# Patient Record
Sex: Female | Born: 2000 | Race: White | Hispanic: No | Smoking: Never smoker
Health system: Southern US, Community
[De-identification: ages and names within clinical notes are randomized; demographics above are authoritative.]

## PROBLEM LIST (undated history)

## (undated) DIAGNOSIS — F419 Anxiety disorder, unspecified: Secondary | ICD-10-CM

## (undated) DIAGNOSIS — F909 Attention-deficit hyperactivity disorder, unspecified type: Secondary | ICD-10-CM

## (undated) DIAGNOSIS — N946 Dysmenorrhea, unspecified: Secondary | ICD-10-CM

## (undated) DIAGNOSIS — I499 Cardiac arrhythmia, unspecified: Secondary | ICD-10-CM

## (undated) DIAGNOSIS — E739 Lactose intolerance, unspecified: Secondary | ICD-10-CM

## (undated) HISTORY — DX: Attention-deficit hyperactivity disorder, unspecified type: F90.9

## (undated) HISTORY — DX: Cardiac arrhythmia, unspecified: I49.9

## (undated) HISTORY — PX: NO PAST SURGERIES: SHX2092

## (undated) HISTORY — DX: Anxiety disorder, unspecified: F41.9

## (undated) HISTORY — DX: Lactose intolerance, unspecified: E73.9

## (undated) HISTORY — DX: Dysmenorrhea, unspecified: N94.6

---
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
## 2020-08-26 ENCOUNTER — Ambulatory Visit: Payer: 59 | Admitting: Internal Medicine

## 2021-03-12 DIAGNOSIS — O1490 Unspecified pre-eclampsia, unspecified trimester: Secondary | ICD-10-CM

## 2021-03-12 DIAGNOSIS — Z98891 History of uterine scar from previous surgery: Secondary | ICD-10-CM | POA: Insufficient documentation

## 2021-03-12 HISTORY — DX: History of uterine scar from previous surgery: Z98.891

## 2021-03-12 HISTORY — DX: Unspecified pre-eclampsia, unspecified trimester: O14.90

## 2021-10-03 IMAGING — US US BREAST*R* LIMITED INC AXILLA
1 series · 3 of 3 positions shown · non-contrast
Comparison: None.

CLINICAL DATA: 19-year-old female with a right breast palpable lump
with associated tenderness when palpated.

EXAM:
ULTRASOUND OF THE RIGHT BREAST

[Series 1: us breast*right* limited inc axilla · 0.07mm/px · 3 of 3 slices shown]
[im 1/3]
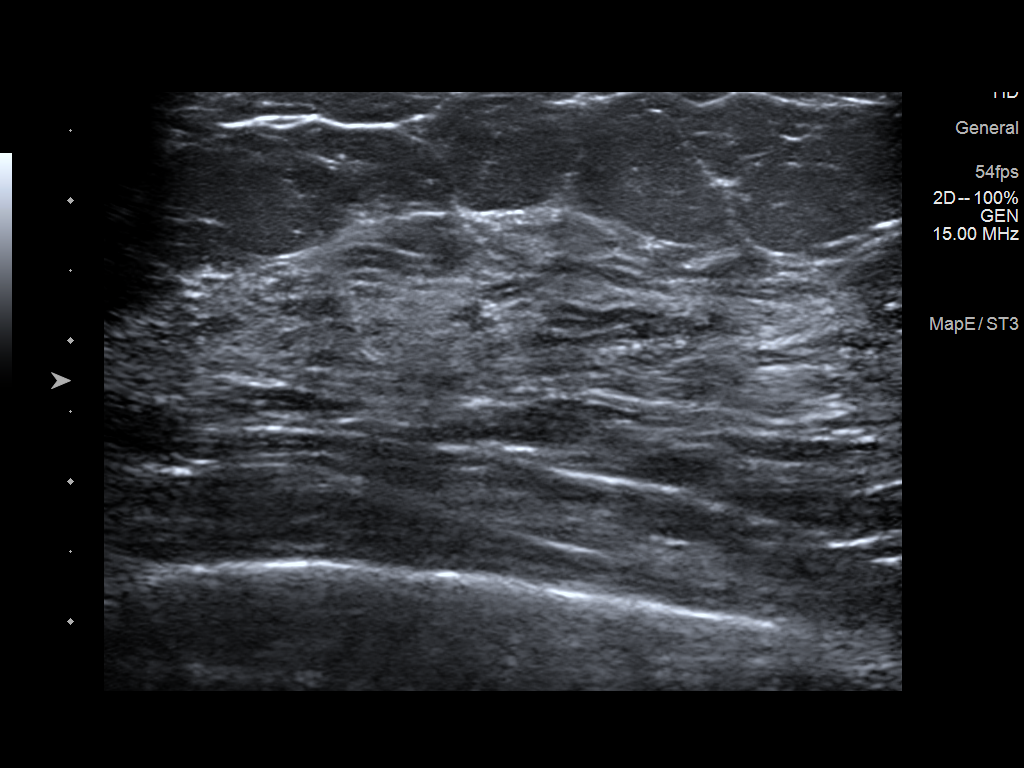
[im 2/3]
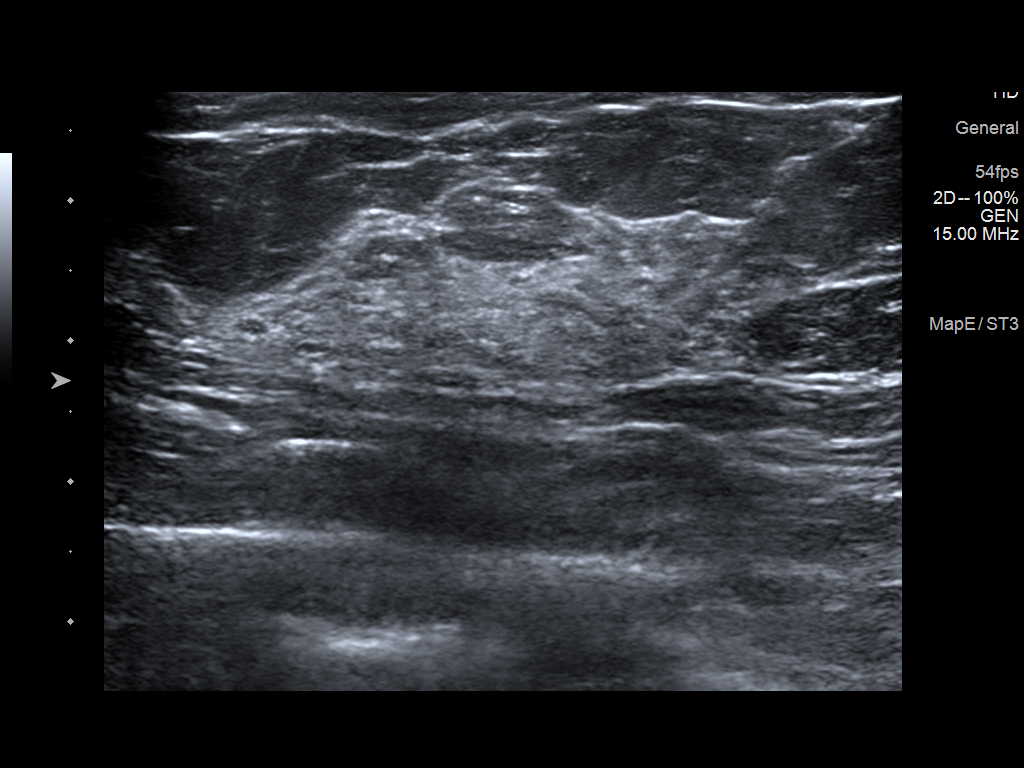
[im 3/3]
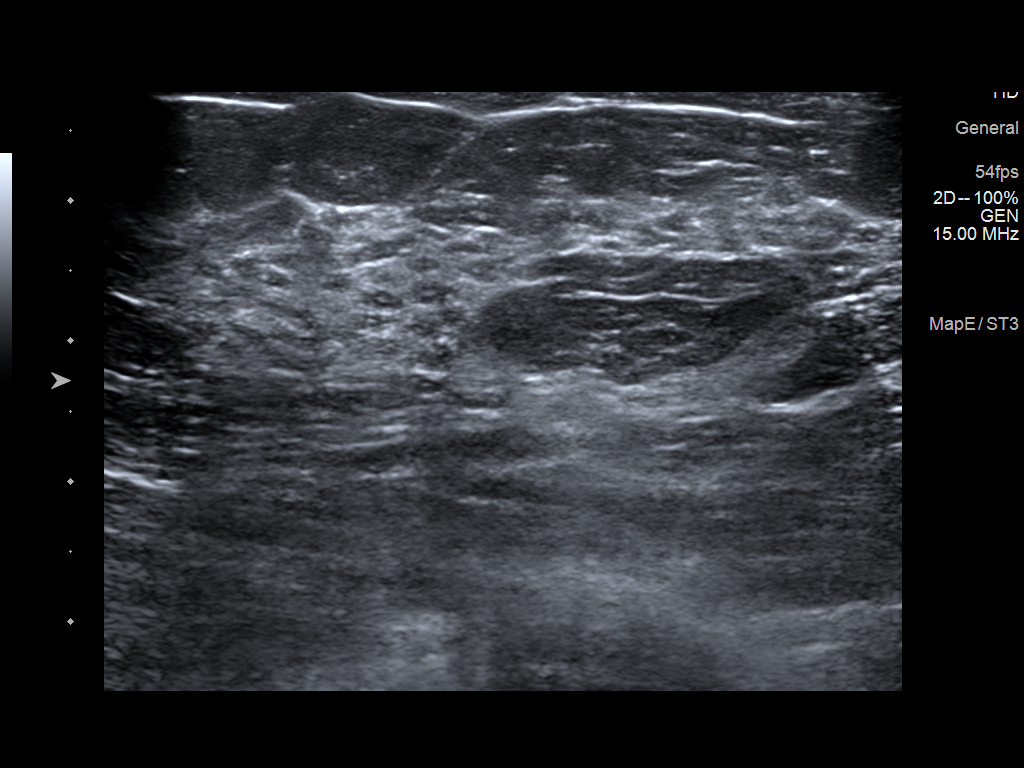

[3 of 3 positions shown; findings below may reference images not displayed]

FINDINGS: Targeted ultrasound is performed, showing focal fibroglandular
tissue without suspicious sonographic abnormality at the site of the
patient's right breast palpable lump.
IMPRESSION: No suspicious sonographic findings.

RECOMMENDATION:
1. Clinical follow-up recommended for the palpable area of concern
in the right breast. Any further workup should be based on clinical
grounds.
2. Screening mammogram at age 40 unless there are persistent or
intervening clinical concerns. (Code:[id number])

I have discussed the findings and recommendations with the patient.
If applicable, a reminder letter will be sent to the patient
regarding the next appointment.

BI-RADS CATEGORY  1: Negative.

## 2021-10-06 DIAGNOSIS — I10 Essential (primary) hypertension: Secondary | ICD-10-CM

## 2021-10-06 HISTORY — DX: Essential (primary) hypertension: I10

## 2021-10-10 DIAGNOSIS — R87619 Unspecified abnormal cytological findings in specimens from cervix uteri: Secondary | ICD-10-CM | POA: Insufficient documentation

## 2021-10-13 ENCOUNTER — Ambulatory Visit: Payer: Medicaid Other | Admitting: Nurse Practitioner

## 2021-10-13 ENCOUNTER — Encounter: Payer: Self-pay | Admitting: Nurse Practitioner

## 2021-10-13 VITALS — BP 136/84 | HR 112 | Temp 98.0°F | Ht 62.21 in | Wt 180.0 lb

## 2021-10-13 DIAGNOSIS — R030 Elevated blood-pressure reading, without diagnosis of hypertension: Secondary | ICD-10-CM | POA: Diagnosis not present

## 2021-10-13 DIAGNOSIS — E66811 Obesity, class 1: Secondary | ICD-10-CM | POA: Insufficient documentation

## 2021-10-13 DIAGNOSIS — Z8614 Personal history of Methicillin resistant Staphylococcus aureus infection: Secondary | ICD-10-CM | POA: Insufficient documentation

## 2021-10-13 DIAGNOSIS — R079 Chest pain, unspecified: Secondary | ICD-10-CM | POA: Insufficient documentation

## 2021-10-13 DIAGNOSIS — E669 Obesity, unspecified: Secondary | ICD-10-CM | POA: Diagnosis not present

## 2021-10-13 DIAGNOSIS — Z6832 Body mass index (BMI) 32.0-32.9, adult: Secondary | ICD-10-CM

## 2021-10-13 DIAGNOSIS — R5383 Other fatigue: Secondary | ICD-10-CM

## 2021-10-13 HISTORY — DX: Personal history of Methicillin resistant Staphylococcus aureus infection: Z86.14

## 2021-10-13 HISTORY — DX: Other fatigue: R53.83

## 2021-10-13 NOTE — Assessment & Plan Note (Signed)
Will refer to infectious disease for assistance with eradicating any MRSA that may be living on her skin.

## 2021-10-13 NOTE — Assessment & Plan Note (Signed)
Most likely multifactorial and probably related to the fact that she is getting interrupted sleep at night.  Will also order blood work for further evaluation, further recommendations may be made based upon these results.

## 2021-10-13 NOTE — Patient Instructions (Signed)
Have patient sign ROI for all previous medical records for all dates, then please request them.

## 2021-10-13 NOTE — Progress Notes (Signed)
Established Patient Office Visit  Subjective   Patient ID: Jenny Welch, female    DOB: 04-May-2000  Age: 21 y.o. MRN: [id number]  Chief Complaint  Patient presents with   new patient    Weight concerns and lack of energy    Patient arrives today to establish care as a new patient.  She reports that she was being treated by pediatrics, but was recommended to establish with adult health care provider.  Her concerns today are as listed below.  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  She would like to discuss how to eradicate this if possible.  Fatigue/obesity: She has a 74-month-old infant and reports that she does sleep 8 hours a night, however it is still interrupted by her daughter waking up.  She reports she has no energy or drive to work out and would like to be evaluated for this.  She does have family history of hypothyroidism.  She would also like to discuss weight loss options.  History of preeclampsia/elevated blood pressure/chest pain: She reports gestational hypertension as well as preeclampsia which is now resolved.  She was treated with labetalol during pregnancy and aspirin.  She also reports that she has weekly chest pain that seems to be triggered by carrying heavy objects but she also experienced shortness of breath with exertion.  She also reports history of irregular heart rhythm but was told by her pediatrician that although rhythm is regular its benign.  Pain will occur in the left side of her chest and will radiate down her left arm.  She does feel anxious when the pain occurs, but does not report anxiety as a trigger.  Has only noticed the pain started occurring ever since her daughter was born having to carry the infant car seat.  Pain will spontaneously resolve within a few hours.  She has not tried any over-the-counter treatment for pain.    Review of Systems  Constitutional:  Negative for fever, malaise/fatigue and  weight loss (weight gain).  Respiratory:  Negative for shortness of breath.   Cardiovascular:  Negative for chest pain.  Gastrointestinal:  Negative for abdominal pain and blood in stool.  Psychiatric/Behavioral:  The patient is nervous/anxious (at times with chest pain).       Objective:     BP 136/84 (BP Location: Right Arm, Patient Position: Sitting, Cuff Size: Large)   Pulse (!) 112   Temp 98 F (36.7 C) (Oral)   Ht 5' 2.21" (1.58 m)   Wt 180 lb (81.6 kg)   SpO2 99%   BMI 32.71 kg/m    Physical Exam Vitals reviewed.  Constitutional:      General: She is not in acute distress.    Appearance: Normal appearance.  HENT:     Head: Normocephalic and atraumatic.  Neck:     Vascular: No carotid bruit.  Cardiovascular:     Rate and Rhythm: Normal rate and regular rhythm.     Pulses: Normal pulses.     Heart sounds: Normal heart sounds.  Pulmonary:     Effort: Pulmonary effort is normal.     Breath sounds: Normal breath sounds.  Skin:    General: Skin is warm and dry.  Neurological:     General: No focal deficit present.     Mental Status: She is alert and oriented to person, place, and time.  Psychiatric:        Mood and Affect: Mood normal.  Behavior: Behavior normal.        Judgment: Judgment normal.     EKG: NSR, rate 93 No results found for any visits on 10/13/21.    The ASCVD Risk score (Arnett DK, et al., 2019) failed to calculate for the following reasons:   The 2019 ASCVD risk score is only valid for ages 49 to 76    Assessment & Plan:   Problem List Items Addressed This Visit       Other   History of MRSA infection - Primary    Will refer to infectious disease for assistance with eradicating any MRSA that may be living on her skin.      Relevant Orders   Ambulatory referral to Infectious Disease   Class 1 obesity without serious comorbidity with body mass index (BMI) of 32.0 to 32.9 in adult    Chronic, recommend we first focus on  lifestyle modification.  Recommend considering trying intermittent fasting with 16-hour fast up to 7 days a week as tolerated.  We discussed the importance of hydration during fasting and to break fast if she experiences any headache, tremor, lightheadedness, overall not feeling well while fasting.  We also discussed possibility of pharmacological treatment and that I would recommend we get her blood pressure a bit more controlled before initiating medication such as phentermine.  [name] would be a good option however this is out of stock and is not covered by her current insurance.  Patient reports her understanding.      Relevant Orders   TSH   Hemoglobin A1c   Lipid panel   Comprehensive metabolic panel   CBC   Ambulatory referral to Infectious Disease   T3, free   T4, free   Vitamin B12   VITAMIN D 25 Hydroxy (Vit-D Deficiency, Fractures)   Elevated blood pressure reading    Blood pressure borderline elevated today, will not initiate antihypertensive therapy at this visit as this is her initial visit.  Will have to monitor closely, if blood pressure remains elevated will consider antihypertensive options.      Relevant Orders   TSH   Hemoglobin A1c   Lipid panel   Comprehensive metabolic panel   CBC   Ambulatory referral to Infectious Disease   T3, free   T4, free   Vitamin B12   VITAMIN D 25 Hydroxy (Vit-D Deficiency, Fractures)   Fatigue    Most likely multifactorial and probably related to the fact that she is getting interrupted sleep at night.  Will also order blood work for further evaluation, further recommendations may be made based upon these results.      Relevant Orders   T3, free   T4, free   Vitamin B12   VITAMIN D 25 Hydroxy (Vit-D Deficiency, Fractures)   Chest pain    Sounds muscular in origin.  Recommend she use ice, heat, Tylenol, and ibuprofen as needed.  May consider muscle relaxer if these medications do not result in improvement in her pain.  May also  consider referral to cardiology if pain persist despite these treatment options/or if characteristics of chest pain change such as if it becomes exertional.  Patient reports understanding.      Relevant Orders   EKG 12-Lead    Return in about 1 month (around 11/13/2021) for CPE with Mikhayla Phillis.    Elenore Paddy, NP

## 2021-10-13 NOTE — Assessment & Plan Note (Signed)
Chronic, recommend we first focus on lifestyle modification.  Recommend considering trying intermittent fasting with 16-hour fast up to 7 days a week as tolerated.  We discussed the importance of hydration during fasting and to break fast if she experiences any headache, tremor, lightheadedness, overall not feeling well while fasting.  We also discussed possibility of pharmacological treatment and that I would recommend we get her blood pressure a bit more controlled before initiating medication such as phentermine.  [name] would be a good option however this is out of stock and is not covered by her current insurance.  Patient reports her understanding.

## 2021-10-13 NOTE — Assessment & Plan Note (Signed)
Sounds muscular in origin.  Recommend she use ice, heat, Tylenol, and ibuprofen as needed.  May consider muscle relaxer if these medications do not result in improvement in her pain.  May also consider referral to cardiology if pain persist despite these treatment options/or if characteristics of chest pain change such as if it becomes exertional.  Patient reports understanding.

## 2021-10-13 NOTE — Assessment & Plan Note (Signed)
Blood pressure borderline elevated today, will not initiate antihypertensive therapy at this visit as this is her initial visit.  Will have to monitor closely, if blood pressure remains elevated will consider antihypertensive options.

## 2021-10-14 ENCOUNTER — Other Ambulatory Visit (INDEPENDENT_AMBULATORY_CARE_PROVIDER_SITE_OTHER): Payer: Medicaid Other

## 2021-10-14 DIAGNOSIS — E669 Obesity, unspecified: Secondary | ICD-10-CM | POA: Diagnosis not present

## 2021-10-14 DIAGNOSIS — Z6832 Body mass index (BMI) 32.0-32.9, adult: Secondary | ICD-10-CM | POA: Diagnosis not present

## 2021-10-14 DIAGNOSIS — R030 Elevated blood-pressure reading, without diagnosis of hypertension: Secondary | ICD-10-CM

## 2021-10-14 DIAGNOSIS — R5383 Other fatigue: Secondary | ICD-10-CM

## 2021-10-14 LAB — CBC
HCT: 43 % (ref 36.0–46.0)
Hemoglobin: 14.3 g/dL (ref 12.0–15.0)
MCHC: 33.3 g/dL (ref 30.0–36.0)
MCV: 87.8 fl (ref 78.0–100.0)
Platelets: 384 10*3/uL (ref 150.0–400.0)
RBC: 4.9 Mil/uL (ref 3.87–5.11)
RDW: 14.5 % (ref 11.5–15.5)
WBC: 9.7 10*3/uL (ref 4.0–10.5)

## 2021-10-14 LAB — COMPREHENSIVE METABOLIC PANEL
ALT: 26 U/L (ref 0–35)
AST: 15 U/L (ref 0–37)
Albumin: 4.4 g/dL (ref 3.5–5.2)
Alkaline Phosphatase: 46 U/L (ref 39–117)
BUN: 9 mg/dL (ref 6–23)
CO2: 26 mEq/L (ref 19–32)
Calcium: 9.2 mg/dL (ref 8.4–10.5)
Chloride: 102 mEq/L (ref 96–112)
Creatinine, Ser: 0.76 mg/dL (ref 0.40–1.20)
GFR: 112.15 mL/min (ref >60.00)
Glucose, Bld: 88 mg/dL (ref 70–99)
Potassium: 4 mEq/L (ref 3.5–5.1)
Sodium: 136 mEq/L (ref 135–145)
Total Bilirubin: 0.4 mg/dL (ref 0.2–1.2)
Total Protein: 7.4 g/dL (ref 6.0–8.3)

## 2021-10-14 LAB — HEMOGLOBIN A1C: Hgb A1c MFr Bld: 5.3 % (ref 4.6–6.5)

## 2021-10-14 LAB — LIPID PANEL
Cholesterol: 159 mg/dL (ref 0–200)
HDL: 37 mg/dL — ABNORMAL LOW (ref >39.00)
LDL Cholesterol: 102 mg/dL — ABNORMAL HIGH (ref 0–99)
NonHDL: 121.95
Total CHOL/HDL Ratio: 4
Triglycerides: 99 mg/dL (ref 0.0–149.0)
VLDL: 19.8 mg/dL (ref 0.0–40.0)

## 2021-10-14 LAB — VITAMIN D 25 HYDROXY (VIT D DEFICIENCY, FRACTURES): VITD: 19.6 ng/mL — ABNORMAL LOW (ref 30.00–100.00)

## 2021-10-14 LAB — T4, FREE: Free T4: 0.9 ng/dL (ref 0.60–1.60)

## 2021-10-14 LAB — T3, FREE: T3, Free: 3.5 pg/mL (ref 2.3–4.2)

## 2021-10-14 LAB — TSH: TSH: 2.02 u[IU]/mL (ref 0.35–5.50)

## 2021-10-14 LAB — VITAMIN B12: Vitamin B-12: 497 pg/mL (ref 211–911)

## 2021-10-17 ENCOUNTER — Telehealth: Payer: Self-pay | Admitting: Nurse Practitioner

## 2021-10-21 ENCOUNTER — Other Ambulatory Visit: Payer: Self-pay

## 2021-10-21 ENCOUNTER — Encounter: Payer: Self-pay | Admitting: Internal Medicine

## 2021-10-21 ENCOUNTER — Ambulatory Visit: Payer: Medicaid Other | Admitting: Internal Medicine

## 2021-10-21 VITALS — BP 138/95 | HR 105 | Temp 97.4°F | Ht 62.0 in | Wt 182.0 lb

## 2021-10-21 DIAGNOSIS — Z872 Personal history of diseases of the skin and subcutaneous tissue: Secondary | ICD-10-CM | POA: Diagnosis not present

## 2021-10-21 DIAGNOSIS — B958 Unspecified staphylococcus as the cause of diseases classified elsewhere: Secondary | ICD-10-CM | POA: Insufficient documentation

## 2021-10-21 MED ORDER — CHLORHEXIDINE GLUCONATE 4 % EX LIQD: CUTANEOUS | 0 refills | Status: DC

## 2021-10-21 MED ORDER — MUPIROCIN 2 % EX OINT: TOPICAL_OINTMENT | CUTANEOUS | 0 refills | Status: DC

## 2021-10-21 NOTE — Assessment & Plan Note (Signed)
  Discussed with patient that Staph aureus is a common skin bacteria amongst many bacteria that can colonize our skin.  She has no history of MRSA documented but did have an infected incision in February 2023 due to MSSA that resolved with Bactrim.  She otherwise self reports prior MRSA infections.  Discussed Staph aureus decolonization protocol and she would like to attempt.  Will do mupirocin 2% applied to nares x 5 days and chlorhexidine 2% daily washes x 5 days.  Follow up as needed.

## 2021-10-21 NOTE — Progress Notes (Signed)
[facility]  Reason for Consult: MRSA colonization  Referring Provider: Jiles Prows, NP   HPI:    Jenny Welch is a 21 y.o. female with PMHx as below who presents to the clinic for MRSA colonization.   Patient recently established care with her new PCP on 10/13/21.  She reports history of multiple skin abscesses secondary to MRSA.  She would like to discuss how to eradicate if possible.  She was referred to our clinic.  Her last infection was several weeks ago and self resolved. She has no history of diabetes.  No other major medical problems.   In February 2023 she had some pain and drainage from an incision.  This was cultured and grew MSSA.  Treated with Bactrim and improved. There are no other culture reports available. She reports otherwise being told she had MRSA from urgent care visits.  Patient's Medications  New Prescriptions   CHLORHEXIDINE (HIBICLENS) 4 % EXTERNAL LIQUID    Apply topically daily for 5 days.   MUPIROCIN OINTMENT (BACTROBAN) 2 %    Apply to each nares daily for 5 days.  Previous Medications   No medications on file  Modified Medications   No medications on file  Discontinued Medications   No medications on file      Past Medical History:  Diagnosis Date   ADHD (attention deficit hyperactivity disorder)    Anxiety    Arrhythmia    Dysmenorrhea    Fatigue 10/13/2021   Lactose intolerance    Migraine without aura and with status migrainosus, not intractable 07/06/2019    Social History   Tobacco Use   Smoking status: Never   Smokeless tobacco: Never  Vaping Use   Vaping Use: Former  Substance Use Topics   Alcohol use: Not Currently   Drug use: Never    Family History  Problem Relation Age of Onset   Allergies Mother    Thyroid disease Mother    Migraines Mother    Allergies Father    Other Father        blood clots   Migraines Brother     Allergies  Allergen Reactions   Milk-Related Compounds    Other      Fruits: oranges    Review of Systems  Constitutional: Negative.   Respiratory: Negative.    Cardiovascular: Negative.   Skin: Negative.       OBJECTIVE:    Vitals:   10/21/21 1352  BP: (!) 138/95  Pulse: (!) 105  Temp: (!) 97.4 F (36.3 C)  TempSrc: Oral  SpO2: 97%  Weight: 182 lb (82.6 kg)  Height: 5\' 2"  (1.575 m)     Body mass index is 33.29 kg/m.  Physical Exam Constitutional:      General: She is not in acute distress.    Appearance: Normal appearance.  Skin:    General: Skin is warm and dry.     Findings: No rash.     Comments: Right hip with old scar from prior boil.   Neurological:     General: No focal deficit present.     Mental Status: She is alert and oriented to person, place, and time.  Psychiatric:        Mood and Affect: Mood normal.        Behavior: Behavior normal.      Labs and Microbiology:     Latest Ref Rng & Units 10/14/2021    8:14 AM 07/03/2019  4:32 PM 11/16/2017   11:00 PM  CBC  WBC 4.0 - 10.5 K/uL 9.7  8.9  11.0   Hemoglobin 12.0 - 15.0 g/dL 26.8  34.1  96.2   Hematocrit 36.0 - 46.0 % 43.0  43.0  44.7   Platelets 150.0 - 400.0 K/uL 384.0  342  343       Latest Ref Rng & Units 10/14/2021    8:14 AM 07/03/2019    4:32 PM 11/16/2017   11:00 PM  CMP  Glucose 70 - 99 mg/dL 88  87  229   BUN 6 - 23 mg/dL 9  7  6    Creatinine 0.40 - 1.20 mg/dL  7.98  9.21   Sodium 135 - 145 mEq/L 136  139  139   Potassium 3.5 - 5.1 mEq/L 4.0  4.1  3.7   Chloride 96 - 112 mEq/L 102  102  106   CO2 19 - 32 mEq/L 26  25  24    Calcium 8.4 - 10.5 mg/dL 9.2  9.6  9.9   Total Protein 6.0 - 8.3 g/dL 7.4  7.3  8.1   Total Bilirubin 0.2 - 1.2 mg/dL 0.4  0.4  0.7   Alkaline Phos 39 - 117 U/L 46  48  38   AST 0 - 37 U/L 15  17  17    ALT 0 - 35 U/L 26  12  14         ASSESSMENT & PLAN:    Staph infection  Discussed with patient that Staph aureus is a common skin bacteria amongst many bacteria that can colonize our skin.  She has no  history of MRSA documented but did have an infected incision in February 2023 due to MSSA that resolved with Bactrim.  She otherwise self reports prior MRSA infections.  Discussed Staph aureus decolonization protocol and she would like to attempt.  Will do mupirocin 2% applied to nares x 5 days and chlorhexidine 2% daily washes x 5 days.  Follow up as needed.        for Infectious Disease [facility] 10/21/2021, 2:19 PM

## 2021-10-21 NOTE — Patient Instructions (Signed)
Thank you for coming to see me today. It was a pleasure seeing you.  To Do: Apply mupirocin to nostrils daily x 5 days Do chlorhexidine daily washes x 5-14 days Follow up as needed  If you have any questions or concerns, please do not hesitate to call the office at [phone].  Take Care,   Gwynn Burly

## 2021-11-17 ENCOUNTER — Ambulatory Visit (INDEPENDENT_AMBULATORY_CARE_PROVIDER_SITE_OTHER): Payer: Medicaid Other | Admitting: Nurse Practitioner

## 2021-11-17 VITALS — BP 134/88 | HR 115 | Temp 98.3°F | Ht 62.0 in | Wt 182.4 lb

## 2021-11-17 DIAGNOSIS — Z0001 Encounter for general adult medical examination with abnormal findings: Secondary | ICD-10-CM | POA: Diagnosis not present

## 2021-11-17 DIAGNOSIS — E669 Obesity, unspecified: Secondary | ICD-10-CM | POA: Diagnosis not present

## 2021-11-17 DIAGNOSIS — E559 Vitamin D deficiency, unspecified: Secondary | ICD-10-CM

## 2021-11-17 DIAGNOSIS — Z6832 Body mass index (BMI) 32.0-32.9, adult: Secondary | ICD-10-CM

## 2021-11-17 DIAGNOSIS — E785 Hyperlipidemia, unspecified: Secondary | ICD-10-CM | POA: Diagnosis not present

## 2021-11-17 DIAGNOSIS — B958 Unspecified staphylococcus as the cause of diseases classified elsewhere: Secondary | ICD-10-CM | POA: Diagnosis not present

## 2021-11-17 NOTE — Progress Notes (Signed)
Established Patient Office Visit  Subjective   Patient ID: Jenny Welch, female    DOB: April 01, 2000  Age: 21 y.o. MRN: [id number]  Chief Complaint  Patient presents with   Annual Exam   vitamin d deficiency    History of skin abscesses: Patient saw infectious disease and they provided her with mupirocin ointment as well as chlorhexidine skin cleanser to hopefully eradicate any MRSA that may be on her skin.  Vitamin D deficiency: Last office visit vitamin D level was collected and it was 15.  She is not currently on any vitamin D supplementation.  Hyperlipidemia: Last office visit LDL was 102, HDL 37, otherwise lipid components normal.  Obesity: Patient is focusing on weight loss.  She has made changes to her diet including trying intermittent fasting which is tolerating well, and focusing on healthier food choices.  She is also exercising more regularly.  Health maintenance: Patient does not want flu shot administered.  Had Pap smear completed within the last month or 2.  Up-to-date on tetanus vaccine.    Review of Systems  Constitutional:  Negative for fever and malaise/fatigue.  Eyes:  Negative for blurred vision and double vision.  Respiratory:  Negative for cough, shortness of breath and wheezing.   Cardiovascular:  Negative for chest pain and palpitations.  Gastrointestinal:  Negative for abdominal pain and blood in stool.  Genitourinary:  Negative for hematuria.  Neurological:  Negative for dizziness, loss of consciousness and headaches.  Psychiatric/Behavioral:  Negative for depression and suicidal ideas.       Objective:     BP 134/88 (BP Location: Left Arm, Patient Position: Sitting, Cuff Size: Normal)   Pulse (!) 115   Temp 98.3 F (36.8 C) (Oral)   Ht 5\' 2"  (1.575 m)   Wt 182 lb 6 oz (82.7 kg)   LMP 10/22/2021   SpO2 99%   BMI 33.36 kg/m  BP Readings from Last 3 Encounters:  11/17/21 134/88  10/21/21 (!) 138/95  10/13/21 136/84   Wt Readings from Last 3  Encounters:  11/17/21 182 lb 6 oz (82.7 kg)  10/21/21 182 lb (82.6 kg)  10/13/21 180 lb (81.6 kg)        11/17/2021   10:14 AM 10/13/2021   11:11 AM  PHQ9 SCORE ONLY  PHQ-9 Total Score 1 3     Physical Exam Vitals reviewed. Exam conducted with a chaperone present.  Constitutional:      Appearance: Normal appearance.  HENT:     Head: Normocephalic and atraumatic.     Right Ear: Tympanic membrane, ear canal and external ear normal.     Left Ear: Tympanic membrane, ear canal and external ear normal.  Eyes:     General:        Right eye: No discharge.        Left eye: No discharge.     Extraocular Movements: Extraocular movements intact.     Conjunctiva/sclera: Conjunctivae normal.     Pupils: Pupils are equal, round, and reactive to light.  Neck:     Vascular: No carotid bruit.  Cardiovascular:     Rate and Rhythm: Normal rate and regular rhythm.     Pulses: Normal pulses.     Heart sounds: Normal heart sounds. No murmur heard. Pulmonary:     Effort: Pulmonary effort is normal.     Breath sounds: Normal breath sounds.  Chest:  Breasts:    Breasts are symmetrical.     Right: Normal. No mass.  Left: Normal. No mass.       Comments: Melida Quitter as chaperone Abdominal:     General: Abdomen is flat. Bowel sounds are normal. There is no distension.     Palpations: Abdomen is soft. There is no mass.     Tenderness: There is no abdominal tenderness.  Musculoskeletal:        General: No tenderness.     Cervical back: Neck supple. No muscular tenderness.     Right lower leg: No edema.     Left lower leg: No edema.  Lymphadenopathy:     Cervical: No cervical adenopathy.     Upper Body:     Right upper body: No supraclavicular adenopathy.     Left upper body: No supraclavicular adenopathy.  Skin:    General: Skin is warm and dry.  Neurological:     General: No focal deficit present.     Mental Status: She is alert and oriented to person, place, and time.      Motor: No weakness.     Gait: Gait normal.  Psychiatric:        Mood and Affect: Mood normal.        Behavior: Behavior normal.        Judgment: Judgment normal.      No results found for any visits on 11/17/21.    The ASCVD Risk score (Arnett DK, et al., 2019) failed to calculate for the following reasons:   The 2019 ASCVD risk score is only valid for ages 51 to 41    Assessment & Plan:   Problem List Items Addressed This Visit       Other   Class 1 obesity without serious comorbidity with body mass index (BMI) of 32.0 to 32.9 in adult    Chronic, encourage patient to continue making lifestyle changes as she has done thus far.  Also encouraged her to start counting calories with goal of 1100 to 1200 cal/day, she was also encouraged to focus on hydration with a goal of 60 to 100 ounces of water per day.  Patient reports understanding.  Follow-up in 3 months to see how she is tolerating lifestyle measures.      Encounter for general adult medical examination with abnormal findings - Primary    Encourage patient to focus on healthy lifestyle, discussed she will be due for Pap smear again in 3 years.  She would not like flu shot administered, so will not administer today.  Skin tag noted to left areola, patient reports has been present for 3 to 4 years without changing in size.  No mass noted under skin tag.  For now patient will monitor at home, if any changes occur she was told to let me know we can ultrasound the area.  She reports understanding.      Vitamin D deficiency    Patient encouraged to start taking vitamin D3 by mouth daily, 1000 IUs daily would be her goal.  Patient return in 3 months to recheck serum level.  Patient reports understanding.      Hyperlipidemia    Patient appears to follow a healthy lifestyle focused on lean proteins and vegetables as well as to continue exercising regularly.      RESOLVED: Staph infection    Patient encouraged to complete Staph  aureus decolonization protocol as recommended by infectious disease.       Return in about 3 months (around 02/16/2022) for f/u with Maralyn Sago.  In addition to performing her  annual physical exam also performed an office visit as outlined above.   Elenore Paddy, NP

## 2021-11-17 NOTE — Patient Instructions (Signed)
Look for either vitamin d3 - 1,000IUS/tablet and take 1 by mouth daily. Or find a multivitamin with vitamin d3 1,000IUS as part of its ingredients and take 1/day.   Weight loss: continue with working out and fasting as tolerated. Drink 6-100 ounces of water a day. Track calories try to eat 1100-1200 calories a day. Focus on lean proteins and veggies for food choices. Avoid processed foods and fast food.

## 2021-11-17 NOTE — Assessment & Plan Note (Signed)
Encourage patient to focus on healthy lifestyle, discussed she will be due for Pap smear again in 3 years.  She would not like flu shot administered, so will not administer today.  Skin tag noted to left areola, patient reports has been present for 3 to 4 years without changing in size.  No mass noted under skin tag.  For now patient will monitor at home, if any changes occur she was told to let me know we can ultrasound the area.  She reports understanding.

## 2021-11-17 NOTE — Assessment & Plan Note (Signed)
Patient encouraged to complete Staph aureus decolonization protocol as recommended by infectious disease.

## 2021-11-17 NOTE — Assessment & Plan Note (Signed)
Chronic, encourage patient to continue making lifestyle changes as she has done thus far.  Also encouraged her to start counting calories with goal of 1100 to 1200 cal/day, she was also encouraged to focus on hydration with a goal of 60 to 100 ounces of water per day.  Patient reports understanding.  Follow-up in 3 months to see how she is tolerating lifestyle measures.

## 2021-11-17 NOTE — Assessment & Plan Note (Signed)
Patient appears to follow a healthy lifestyle focused on lean proteins and vegetables as well as to continue exercising regularly.

## 2021-11-17 NOTE — Assessment & Plan Note (Signed)
Patient encouraged to start taking vitamin D3 by mouth daily, 1000 IUs daily would be her goal.  Patient return in 3 months to recheck serum level.  Patient reports understanding.

## 2021-11-26 ENCOUNTER — Encounter (HOSPITAL_BASED_OUTPATIENT_CLINIC_OR_DEPARTMENT_OTHER): Payer: Self-pay

## 2021-11-26 ENCOUNTER — Emergency Department (HOSPITAL_BASED_OUTPATIENT_CLINIC_OR_DEPARTMENT_OTHER)
Admission: EM | Admit: 2021-11-26 | Discharge: 2021-11-26 | Disposition: A | Discharge status: Home / Self Care | Payer: Medicaid Other | Attending: Emergency Medicine | Admitting: Emergency Medicine

## 2021-11-26 ENCOUNTER — Emergency Department (HOSPITAL_BASED_OUTPATIENT_CLINIC_OR_DEPARTMENT_OTHER): Payer: Medicaid Other

## 2021-11-26 DIAGNOSIS — I10 Essential (primary) hypertension: Secondary | ICD-10-CM | POA: Insufficient documentation

## 2021-11-26 DIAGNOSIS — H538 Other visual disturbances: Secondary | ICD-10-CM | POA: Insufficient documentation

## 2021-11-26 DIAGNOSIS — R42 Dizziness and giddiness: Secondary | ICD-10-CM | POA: Insufficient documentation

## 2021-11-26 DIAGNOSIS — J029 Acute pharyngitis, unspecified: Secondary | ICD-10-CM | POA: Diagnosis not present

## 2021-11-26 DIAGNOSIS — R059 Cough, unspecified: Secondary | ICD-10-CM | POA: Diagnosis not present

## 2021-11-26 DIAGNOSIS — Z20822 Contact with and (suspected) exposure to covid-19: Secondary | ICD-10-CM | POA: Insufficient documentation

## 2021-11-26 DIAGNOSIS — M7918 Myalgia, other site: Secondary | ICD-10-CM | POA: Insufficient documentation

## 2021-11-26 DIAGNOSIS — R000 Tachycardia, unspecified: Secondary | ICD-10-CM | POA: Insufficient documentation

## 2021-11-26 DIAGNOSIS — R509 Fever, unspecified: Secondary | ICD-10-CM | POA: Insufficient documentation

## 2021-11-26 DIAGNOSIS — J111 Influenza due to unidentified influenza virus with other respiratory manifestations: Secondary | ICD-10-CM

## 2021-11-26 LAB — CBC WITH DIFFERENTIAL/PLATELET
Abs Immature Granulocytes: 0.07 10*3/uL (ref 0.00–0.07)
Basophils Absolute: 0.1 10*3/uL (ref 0.0–0.1)
Basophils Relative: 0 %
Eosinophils Absolute: 0.1 10*3/uL (ref 0.0–0.5)
Eosinophils Relative: 1 %
HCT: 41.3 % (ref 36.0–46.0)
Hemoglobin: 14.1 g/dL (ref 12.0–15.0)
Immature Granulocytes: 0 %
Lymphocytes Relative: 5 %
Lymphs Abs: 1 10*3/uL (ref 0.7–4.0)
MCH: 29.3 pg (ref 26.0–34.0)
MCHC: 34.1 g/dL (ref 30.0–36.0)
MCV: 85.7 fL (ref 80.0–100.0)
Monocytes Absolute: 1.4 10*3/uL — ABNORMAL HIGH (ref 0.1–1.0)
Monocytes Relative: 7 %
Neutro Abs: 17.7 10*3/uL — ABNORMAL HIGH (ref 1.7–7.7)
Neutrophils Relative %: 87 %
Platelets: 379 10*3/uL (ref 150–400)
RBC: 4.82 MIL/uL (ref 3.87–5.11)
RDW: 12.6 % (ref 11.5–15.5)
WBC: 20.3 10*3/uL — ABNORMAL HIGH (ref 4.0–10.5)
nRBC: 0 % (ref 0.0–0.2)

## 2021-11-26 LAB — PREGNANCY, URINE: Preg Test, Ur: NEGATIVE

## 2021-11-26 LAB — COMPREHENSIVE METABOLIC PANEL
ALT: 21 U/L (ref 0–44)
AST: 16 U/L (ref 15–41)
Albumin: 4.5 g/dL (ref 3.5–5.0)
Alkaline Phosphatase: 37 U/L — ABNORMAL LOW (ref 38–126)
Anion gap: 9 (ref 5–15)
BUN: 11 mg/dL (ref 6–20)
CO2: 22 mmol/L (ref 22–32)
Calcium: 9.9 mg/dL (ref 8.9–10.3)
Chloride: 102 mmol/L (ref 98–111)
Creatinine, Ser: 0.79 mg/dL (ref 0.44–1.00)
GFR, Estimated: >60 mL/min (ref >60)
Glucose, Bld: 117 mg/dL — ABNORMAL HIGH (ref 70–99)
Potassium: 4 mmol/L (ref 3.5–5.1)
Sodium: 133 mmol/L — ABNORMAL LOW (ref 135–145)
Total Bilirubin: 0.4 mg/dL (ref 0.3–1.2)
Total Protein: 7.5 g/dL (ref 6.5–8.1)

## 2021-11-26 LAB — URINALYSIS, ROUTINE W REFLEX MICROSCOPIC
Bilirubin Urine: NEGATIVE
Glucose, UA: NEGATIVE mg/dL
Hgb urine dipstick: NEGATIVE
Ketones, ur: NEGATIVE mg/dL
Leukocytes,Ua: NEGATIVE
Nitrite: NEGATIVE
Protein, ur: NEGATIVE mg/dL
Specific Gravity, Urine: 1.005 (ref 1.005–1.030)
pH: 6.5 (ref 5.0–8.0)

## 2021-11-26 LAB — PROTIME-INR
INR: 1 (ref 0.8–1.2)
Prothrombin Time: 12.7 seconds (ref 11.4–15.2)

## 2021-11-26 LAB — SARS CORONAVIRUS 2 BY RT PCR: SARS Coronavirus 2 by RT PCR: NEGATIVE

## 2021-11-26 LAB — APTT: aPTT: 29 seconds (ref 24–36)

## 2021-11-26 LAB — LACTIC ACID, PLASMA: Lactic Acid, Venous: 1.5 mmol/L (ref 0.5–1.9)

## 2021-11-26 LAB — GROUP A STREP BY PCR: Group A Strep by PCR: NOT DETECTED

## 2021-11-26 MED ORDER — LACTATED RINGERS IV BOLUS
1000.0000 mL | Freq: Once | INTRAVENOUS | Status: AC
  Administered 2021-11-26: 1000 mL via INTRAVENOUS

## 2021-11-26 MED ORDER — KETOROLAC TROMETHAMINE 30 MG/ML IJ SOLN
30.0000 mg | Freq: Once | INTRAMUSCULAR | Status: AC
  Administered 2021-11-26: 30 mg via INTRAVENOUS
  Filled 2021-11-26: qty 1

## 2021-11-26 NOTE — ED Notes (Signed)
Pt verbalizes understanding of discharge instructions. Opportunity for questioning and answers were provided. Pt discharged from ED to home with family.    

## 2021-11-26 NOTE — ED Triage Notes (Signed)
Pt states that last night she began to have fevers, chills, dry cough, sore throat, dizziness and SOB.

## 2021-11-26 NOTE — Discharge Instructions (Signed)
Drink plenty of fluids.  Take ibuprofen and/or acetaminophen as needed for fever and aching.  Return if you have any new or concerning symptoms.

## 2021-11-26 NOTE — ED Provider Notes (Signed)
[facility] EMERGENCY DEPT Provider Note   CSN: [id number] Arrival date & time: 11/26/21  0321     History  Chief Complaint  Patient presents with   Fever   Dizziness    Jenny Welch is a 21 y.o. female.  The history is provided by the patient.  Fever Dizziness  She has history of hypertension, hyperlipidemia and comes in because of fever and chills and dizziness.  She started having chills tonight and had fever as high as 102.2.  She took a dose of acetaminophen.  She went to sleep and woke up feeling dizzy with her vision blurred.  She has had a sore throat and slight cough which is nonproductive.  She complains of generalized body aches.  She denies nausea, vomiting, diarrhea.  She denies urinary urgency, frequency, tenesmus, dysuria.  She denies any diarrhea.  She is currently on her menses, denies using any contraception.  She denies any sick contacts.  Of note, she has developed sweats since taking acetaminophen.   Home Medications Prior to Admission medications   Medication Sig Start Date End Date Taking? Authorizing Provider  chlorhexidine (HIBICLENS) 4 % external liquid Apply topically daily for 5 days. 10/21/21   Kathlynn Grate, DO  [employer] ANTISEPTIC SKIN CLEANSER 4 % SOLN Apply topically daily. 10/21/21   [provider]  Multiple Vitamin (MULTIVITAMIN) tablet Take 1 tablet by mouth daily.    [provider]  mupirocin ointment (BACTROBAN) 2 % Apply to each nares daily for 5 days. 10/21/21   Kathlynn Grate, DO      Allergies    Milk-related compounds and Other    Review of Systems   Review of Systems  Constitutional:  Positive for fever.  Neurological:  Positive for dizziness.  All other systems reviewed and are negative.   Physical Exam Updated Vital Signs BP (!) 147/97   Pulse (!) 152   Temp 99.9 F (37.7 C) (Oral)   Resp 20   Ht 5\' 2"  (1.575 m)   Wt 82.6 kg   LMP 10/22/2021   SpO2 97%   BMI 33.29 kg/m  Physical  Exam Vitals and nursing note reviewed.   21 year old female, resting comfortably and in no acute distress. Vital signs are significant for elevated blood pressure and heart rate. Oxygen saturation is 97%, which is normal. Head is normocephalic and atraumatic. PERRLA, EOMI. Oropharynx is clear. Neck is nontender and supple without adenopathy or JVD. Back is nontender and there is no CVA tenderness. Lungs are clear without rales, wheezes, or rhonchi. Chest is nontender. Heart has regular rate and rhythm without murmur. Abdomen is soft, flat, nontender. Extremities have no cyanosis or edema, full range of motion is present. Skin is warm and dry without rash. Neurologic: Mental status is normal, cranial nerves are intact, moves all extremities equally.  ED Results / Procedures / Treatments   Labs (all labs ordered are listed, but only abnormal results are displayed) Labs Reviewed  COMPREHENSIVE METABOLIC PANEL - Abnormal; Notable for the following components:      Result Value   Sodium 133 (*)    Glucose, Bld 117 (*)    Alkaline Phosphatase 37 (*)    All other components within normal limits  CBC WITH DIFFERENTIAL/PLATELET - Abnormal; Notable for the following components:   WBC 20.3 (*)    Neutro Abs 17.7 (*)    Monocytes Absolute 1.4 (*)    All other components within normal limits  URINALYSIS, ROUTINE W REFLEX  MICROSCOPIC - Abnormal; Notable for the following components:   Color, Urine COLORLESS (*)    All other components within normal limits  SARS CORONAVIRUS 2 BY RT PCR  GROUP A STREP BY PCR  CULTURE, BLOOD (ROUTINE X 2)  CULTURE, BLOOD (ROUTINE X 2)  URINE CULTURE  LACTIC ACID, PLASMA  PROTIME-INR  APTT  PREGNANCY, URINE    EKG EKG Interpretation  Date/Time:  Saturday November 26 2021 03:31:17 EDT Ventricular Rate:  149 PR Interval:  124 QRS Duration: 82 QT Interval:  260 QTC Calculation: 410 R Axis:   59 Text Interpretation: Sinus tachycardia Nonspecific  repol abnormality, diffuse leads - likely rate-related Baseline wander in lead(s) V3 V4 V5 V6 No old tracing to compare Confirmed by Delora Fuel ([id number]) on 11/26/2021 3:37:37 AM  Radiology DG Chest Port 1 View  Result Date: 11/26/2021 CLINICAL DATA:  Sepsis EXAM: PORTABLE CHEST 1 VIEW COMPARISON:  11/16/2017 FINDINGS: The heart size and mediastinal contours are within normal limits. Both lungs are clear. The visualized skeletal structures are unremarkable. IMPRESSION: No active disease. Electronically Signed   By: Fidela Salisbury M.D.   On: 11/26/2021 04:10    Procedures Procedures  Cardiac monitor shows sinus tachycardia, per my interpretation.  Medications Ordered in ED Medications  lactated ringers bolus 1,000 mL (0 mLs Intravenous Stopped 11/26/21 0529)  ketorolac (TORADOL) 30 MG/ML injection 30 mg (30 mg Intravenous Given 11/26/21 0408)    ED Course/ Medical Decision Making/ A&P                           Medical Decision Making Amount and/or Complexity of Data Reviewed Labs: ordered. Radiology: ordered.  Risk Prescription drug management.   Fever, chills, cough, body aches.  This is an influenza-like illness.  Consider COVID-19.  Also, consider streptococcal pharyngitis.  I have low.  COVID-19 and streptococcal PCR tests.  I have ordered chest x-ray to rule out pneumonia.  Because of fever and tachycardia, I have placed the patient on the evolving sepsis pathway.  I have ordered IV fluids and ketorolac.  Please note that in spite of tachycardia,, patient is nontoxic in appearance.  Chest x-ray shows no evidence of pneumonia.  I have independently viewed the images, and agree with the radiologist's interpretation.  I have reviewed and interpreted her laboratory test, and my interpretation is leukocytosis which is nonspecific, mild hyponatremia which is not felt to be clinically significant, mild elevated random glucose likely related to stress, normal lactic acid level, normal  urinalysis.  She feels considerably better following IV fluids and ketorolac although she is still mildly tachycardic.  She is nontoxic in appearance and I feel she is safe for discharge.  She is encouraged to continue forcing fluids at home, continue using over-the-counter NSAIDs and acetaminophen as needed for fever or aching.  Return precautions discussed.  Final Clinical Impression(s) / ED Diagnoses Final diagnoses:  Influenza-like illness    Rx / DC Orders ED Discharge Orders     None         Delora Fuel, MD 56/43/32 [phone]

## 2021-11-27 LAB — URINE CULTURE

## 2021-12-01 LAB — CULTURE, BLOOD (ROUTINE X 2)
Culture: NO GROWTH
Culture: NO GROWTH

## 2022-02-16 ENCOUNTER — Ambulatory Visit: Payer: Medicaid Other | Admitting: Nurse Practitioner

## 2022-03-29 DIAGNOSIS — O09291 Supervision of pregnancy with other poor reproductive or obstetric history, first trimester: Secondary | ICD-10-CM | POA: Insufficient documentation

## 2022-04-05 DIAGNOSIS — Z2839 Other underimmunization status: Secondary | ICD-10-CM | POA: Insufficient documentation

## 2022-06-01 ENCOUNTER — Encounter: Payer: Self-pay | Admitting: Nurse Practitioner

## 2022-06-01 ENCOUNTER — Ambulatory Visit (INDEPENDENT_AMBULATORY_CARE_PROVIDER_SITE_OTHER): Payer: Medicaid Other | Admitting: Nurse Practitioner

## 2022-06-01 DIAGNOSIS — Z6832 Body mass index (BMI) 32.0-32.9, adult: Secondary | ICD-10-CM

## 2022-06-01 DIAGNOSIS — E669 Obesity, unspecified: Secondary | ICD-10-CM | POA: Diagnosis not present

## 2022-06-01 NOTE — Assessment & Plan Note (Signed)
Had recommended lifestyle modification previously, for now will hold off on trying to lose weight until after pregnancy. Eat healthy well-balanced diet. Continue prenatal and baby aspirin per OBGYN direction. Follow-up with OBGYN routinely. Return to this office in 6 months for CPE.

## 2022-06-01 NOTE — Progress Notes (Signed)
   Established Patient Office Visit  Subjective   Patient ID: Jenny Welch, female    DOB: [birth year]  Age: 22 y.o. MRN: [id number]  Chief Complaint  Patient presents with   Medical Management of Chronic Issues    No new concern     Currently 5 months pregnant. Has been feeling well, sees her OBGYN regularly. Taking prenatal regularly and baby aspirin for preeclampsia prevention.     Review of Systems  Constitutional:  Positive for malaise/fatigue. Negative for chills and fever.  Respiratory:  Negative for shortness of breath.   Cardiovascular:  Negative for chest pain.  Gastrointestinal:  Negative for blood in stool.  Genitourinary:        (-) vaginal bleeding      Objective:     LMP 10/22/2021  BP Readings from Last 3 Encounters:  11/26/21 120/79  11/17/21 134/88  10/21/21 (!) 138/95   Wt Readings from Last 3 Encounters:  11/26/21 182 lb (82.6 kg)  11/17/21 182 lb 6 oz (82.7 kg)  10/21/21 182 lb (82.6 kg)      Physical Exam Vitals reviewed.  Constitutional:      General: She is not in acute distress.    Appearance: Normal appearance.  HENT:     Head: Normocephalic and atraumatic.  Neck:     Vascular: No carotid bruit.  Cardiovascular:     Rate and Rhythm: Normal rate and regular rhythm.     Pulses: Normal pulses.     Heart sounds: Normal heart sounds.  Pulmonary:     Effort: Pulmonary effort is normal.     Breath sounds: Normal breath sounds.  Skin:    General: Skin is warm and dry.  Neurological:     General: No focal deficit present.     Mental Status: She is alert and oriented to person, place, and time.  Psychiatric:        Mood and Affect: Mood normal.        Behavior: Behavior normal.        Judgment: Judgment normal.      No results found for any visits on 06/01/22.    The ASCVD Risk score (Arnett DK, et al., 2019) failed to calculate for the following reasons:   The 2019 ASCVD risk score is only valid for ages 67 to 72     Assessment & Plan:   Problem List Items Addressed This Visit       Other   Class 1 obesity without serious comorbidity with body mass index (BMI) of 32.0 to 32.9 in adult - Primary    Had recommended lifestyle modification previously, for now will hold off on trying to lose weight until after pregnancy. Eat healthy well-balanced diet. Continue prenatal and baby aspirin per OBGYN direction. Follow-up with OBGYN routinely. Return to this office in 6 months for CPE.        Return for CPE with Judson Roch end of september or early october.    Ailene Ards, NP

## 2022-08-22 DIAGNOSIS — O24410 Gestational diabetes mellitus in pregnancy, diet controlled: Secondary | ICD-10-CM | POA: Insufficient documentation

## 2022-08-30 DIAGNOSIS — Z2821 Immunization not carried out because of patient refusal: Secondary | ICD-10-CM | POA: Insufficient documentation

## 2022-08-30 DIAGNOSIS — O26843 Uterine size-date discrepancy, third trimester: Secondary | ICD-10-CM | POA: Insufficient documentation

## 2022-11-24 ENCOUNTER — Other Ambulatory Visit: Payer: Self-pay | Admitting: Nurse Practitioner

## 2022-11-24 ENCOUNTER — Ambulatory Visit: Payer: Medicaid Other | Admitting: Nurse Practitioner

## 2022-11-24 ENCOUNTER — Encounter: Payer: Self-pay | Admitting: Nurse Practitioner

## 2022-11-24 VITALS — BP 120/80 | HR 96 | Temp 97.7°F | Ht 62.0 in | Wt 162.4 lb

## 2022-11-24 DIAGNOSIS — Z0000 Encounter for general adult medical examination without abnormal findings: Secondary | ICD-10-CM | POA: Diagnosis not present

## 2022-11-24 DIAGNOSIS — B009 Herpesviral infection, unspecified: Secondary | ICD-10-CM | POA: Diagnosis not present

## 2022-11-24 DIAGNOSIS — Z0001 Encounter for general adult medical examination with abnormal findings: Secondary | ICD-10-CM

## 2022-11-24 DIAGNOSIS — Z1159 Encounter for screening for other viral diseases: Secondary | ICD-10-CM | POA: Diagnosis not present

## 2022-11-24 LAB — CBC
HCT: 38.6 % (ref 36.0–46.0)
Hemoglobin: 12.4 g/dL (ref 12.0–15.0)
MCHC: 32.2 g/dL (ref 30.0–36.0)
MCV: 83.5 fl (ref 78.0–100.0)
Platelets: 424 10*3/uL — ABNORMAL HIGH (ref 150.0–400.0)
RBC: 4.62 Mil/uL (ref 3.87–5.11)
RDW: 15.8 % — ABNORMAL HIGH (ref 11.5–15.5)
WBC: 7.8 10*3/uL (ref 4.0–10.5)

## 2022-11-24 LAB — COMPREHENSIVE METABOLIC PANEL
ALT: 33 U/L (ref 0–35)
AST: 24 U/L (ref 0–37)
Albumin: 4.3 g/dL (ref 3.5–5.2)
Alkaline Phosphatase: 64 U/L (ref 39–117)
BUN: 10 mg/dL (ref 6–23)
CO2: 27 mEq/L (ref 19–32)
Calcium: 9.4 mg/dL (ref 8.4–10.5)
Chloride: 101 mEq/L (ref 96–112)
Creatinine, Ser: 0.73 mg/dL (ref 0.40–1.20)
GFR: 116.79 mL/min (ref >60.00)
Glucose, Bld: 87 mg/dL (ref 70–99)
Potassium: 4.3 mEq/L (ref 3.5–5.1)
Sodium: 137 mEq/L (ref 135–145)
Total Bilirubin: 0.5 mg/dL (ref 0.2–1.2)
Total Protein: 7.7 g/dL (ref 6.0–8.3)

## 2022-11-24 MED ORDER — VALACYCLOVIR HCL 1 G PO TABS
1000.0000 mg | ORAL_TABLET | Freq: Every day | ORAL | 3 refills | Status: DC
Start: 2022-11-24 — End: 2023-05-15

## 2022-11-24 NOTE — Progress Notes (Signed)
Complete physical exam  Patient: Jenny Welch   DOB: June 13, 2000   22 y.o. Female  MRN: [id number]  Subjective:    Chief Complaint  Patient presents with   Annual Exam    Jenny Welch is a 22 y.o. female who presents today for a complete physical exam. She reports consuming a general diet.  Exercise: walks regularly.  She generally feels well. She reports sleeping fairly well. She does have additional problems to discuss today.   She reports she had a vaginal rash shortly after giving birth to her son.  Evaluation the hospital was completed which did find antibodies for HSV-2.  She was treated with Valtrex and rash resolved.  She has questions regarding HSV-2 infection.  Patient is no longer breast-feeding.  Most recent fall risk assessment:    11/24/2022   11:39 AM  Fall Risk   Falls in the past year? 0  Number falls in past yr: 0  Injury with Fall? 0  Risk for fall due to : No Fall Risks  Follow up Falls evaluation completed     Most recent depression screenings:    11/24/2022   11:39 AM 06/01/2022    2:11 PM  PHQ 2/9 Scores  PHQ - 2 Score 0 0    Vision:Not within last year  and Dental: No current dental problems and No regular dental care   Past Medical History:  Diagnosis Date   ADHD (attention deficit hyperactivity disorder)    Anxiety    Arrhythmia    Dysmenorrhea    Fatigue 10/13/2021   History of MRSA infection 10/13/2021   Hypertensive disorder 10/06/2021   dx in early pregnancy --> SIPE at 30 wga     Lactose intolerance    Migraine without aura and with status migrainosus, not intractable 07/06/2019   Pre-eclampsia 03/12/2021   S/P cesarean section 03/12/2021   Past Surgical History:  Procedure Laterality Date   NO PAST SURGERIES     Family History  Problem Relation Age of Onset   Allergies Mother    Thyroid disease Mother    Migraines Mother    Allergies Father    Other Father        blood clots   Migraines Brother    Allergies  Allergen  Reactions   Milk-Related Compounds    Other     Fruits: oranges      Patient Care Team: Elenore Paddy, NP as PCP - General (Nurse Practitioner)   Outpatient Medications Prior to Visit  Medication Sig   [DISCONTINUED] aspirin 81 MG chewable tablet Chew 81 mg by mouth daily. (Patient not taking: Reported on 11/24/2022)   [DISCONTINUED] Prenatal Vit-Fe Fumarate-FA (PRENATAL PO) Take by mouth. (Patient not taking: Reported on 11/24/2022)   No facility-administered medications prior to visit.    Review of Systems  Constitutional:  Negative for chills, fever, malaise/fatigue and weight loss.  HENT:  Negative for hearing loss and tinnitus.   Eyes:  Negative for blurred vision and double vision.  Respiratory:  Negative for cough and shortness of breath.   Cardiovascular:  Negative for chest pain and palpitations.  Gastrointestinal:  Negative for abdominal pain and blood in stool.  Genitourinary:  Negative for hematuria.  Neurological:  Negative for dizziness and headaches.  Psychiatric/Behavioral:  Negative for depression and suicidal ideas. The patient is not nervous/anxious.           Objective:     BP 120/80   Pulse 96   Temp 97.7  F (36.5 C) (Temporal)   Ht 5\' 2"  (1.575 m)   Wt 162 lb 6 oz (73.7 kg)   SpO2 100%   BMI 29.70 kg/m  BP Readings from Last 3 Encounters:  11/24/22 120/80  11/26/21 120/79  11/17/21 134/88   Wt Readings from Last 3 Encounters:  11/24/22 162 lb 6 oz (73.7 kg)  11/26/21 182 lb (82.6 kg)  11/17/21 182 lb 6 oz (82.7 kg)      Physical Exam Vitals reviewed. Exam conducted with a chaperone present.  Constitutional:      Appearance: Normal appearance.  HENT:     Head: Normocephalic and atraumatic.     Right Ear: Tympanic membrane, ear canal and external ear normal.     Left Ear: Tympanic membrane, ear canal and external ear normal.  Eyes:     General:        Right eye: No discharge.        Left eye: No discharge.     Extraocular  Movements: Extraocular movements intact.     Conjunctiva/sclera: Conjunctivae normal.     Pupils: Pupils are equal, round, and reactive to light.  Neck:     Vascular: No carotid bruit.  Cardiovascular:     Rate and Rhythm: Normal rate and regular rhythm.     Pulses: Normal pulses.     Heart sounds: Normal heart sounds. No murmur heard. Pulmonary:     Effort: Pulmonary effort is normal.     Breath sounds: Normal breath sounds.  Chest:  Breasts:    Breasts are symmetrical.     Right: Normal.     Left: Normal.  Abdominal:     General: Abdomen is flat. Bowel sounds are normal. There is no distension.     Palpations: Abdomen is soft. There is no mass.     Tenderness: There is no abdominal tenderness.  Musculoskeletal:        General: No tenderness.     Cervical back: Neck supple. No muscular tenderness.     Right lower leg: No edema.     Left lower leg: No edema.  Lymphadenopathy:     Cervical: No cervical adenopathy.     Upper Body:     Right upper body: No supraclavicular adenopathy.     Left upper body: No supraclavicular adenopathy.  Skin:    General: Skin is warm and dry.  Neurological:     General: No focal deficit present.     Mental Status: She is alert and oriented to person, place, and time.     Motor: No weakness.     Gait: Gait normal.  Psychiatric:        Mood and Affect: Mood normal.        Behavior: Behavior normal.        Judgment: Judgment normal.      No results found for any visits on 11/24/22.     Assessment & Plan:    Routine Health Maintenance and Physical Exam  Immunization History  Administered Date(s) Administered   DTaP 09/28/2000, 11/28/2000, 01/16/2001, 01/21/2002, 10/03/2004   HIB (PRP-OMP) 09/28/2000, 11/28/2000, 01/16/2001, 01/21/2002   HPV 9-valent 12/21/2011, 12/23/2012, 12/02/2013   Hepatitis A 01/20/2008, 12/03/2008   Hepatitis B 05/09/00, 08/29/2000, 04/19/2001   IPV 09/28/2000, 11/28/2000, 04/19/2001, 10/03/2004    Influenza,inj,Quad PF,6+ Mos 12/19/2017   MMR 07/18/2001, 10/03/2004   MenQuadfi_Meningococcal Groups ACYW Conjugate 12/21/2011, 02/07/2017   Tdap 03/07/2021   Varicella 03/11/2021    Health Maintenance  Topic Date Due  Hepatitis C Screening  Never done   INFLUENZA VACCINE  06/04/2023 (Originally 10/05/2022)   Cervical Cancer Screening (Pap smear)  03/29/2025   DTaP/Tdap/Td (7 - Td or Tdap) 03/08/2031   HPV VACCINES  Completed   HIV Screening  Completed   COVID-19 Vaccine  Discontinued    Discussed health benefits of physical activity, and encouraged her to engage in regular exercise appropriate for her age and condition.  Problem List Items Addressed This Visit       Other   Encounter for general adult medical examination with abnormal findings - Primary    Encourage patient to focus on healthy lifestyle and stay up-to-date with recommended screening tests. Patient declined flu shot today. Handout provided.      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 We discussed episodic versus suppressive treatment options.  Would recommend episodic for now, patient is agreeable. Collect metabolic panel, pending results send in prescription for Valtrex to be taken on an as-needed basis. Also discussed safe sex practices and that transmission can occur even without an outbreak present.      Encounter for hepatitis C screening test for low risk patient    Labs ordered, further recommendations may be made based upon these results       Relevant Orders   CBC   Comprehensive metabolic panel   Hepatitis C antibody   Return in about 6 months (around 05/24/2023) for F/U with Maralyn Sago.  In addition to performing a physical exam and office visit was performed as discussed above.     Elenore Paddy, NP

## 2022-11-24 NOTE — Assessment & Plan Note (Signed)
Labs ordered, further recommendations may be made based upon these results. 

## 2022-11-24 NOTE — Assessment & Plan Note (Addendum)
Encourage patient to focus on healthy lifestyle and stay up-to-date with recommended screening tests. Patient declined flu shot today. Handout provided.

## 2022-11-24 NOTE — Assessment & Plan Note (Addendum)
Chronic, First outbreak occurred earlier this year. We discussed episodic versus suppressive treatment options.  Would recommend episodic for now, patient is agreeable. Collect metabolic panel, pending results send in prescription for Valtrex to be taken on an as-needed basis. Also discussed safe sex practices and that transmission can occur even without an outbreak present.

## 2022-11-25 LAB — HEPATITIS C ANTIBODY: Hepatitis C Ab: NONREACTIVE

## 2023-03-14 ENCOUNTER — Ambulatory Visit: Payer: Self-pay | Admitting: Nurse Practitioner

## 2023-03-14 NOTE — Telephone Encounter (Signed)
 Copied from CRM [id number]. Topic: Clinical - Red Word Triage >> Mar 14, 2023 11:05 AM Robinson DEL wrote: [facility] that prompted transfer to Nurse Triage: Patient states she has a sore throat, hurts to swallow and has a red bump on back of throat. Thick drainage in back of throat.   Chief Complaint: Sore throat  Symptoms: Sore throat, painful swallowing, nasal congestion, red spot on tonsils  Frequency: Constant  Pertinent Negatives: Patient denies fever, difficulty breathing Disposition: [] ED /[] Urgent Care (no appt availability in office) / [x] Appointment(In office/virtual)/ []  [place] [facility]/ [] Home Care/ [] Refused Recommended Disposition /[] [place] [facility]/ []  Follow-up with PCP Additional Notes: Patient reports she has had nasal congestion for the last couple of weeks. She states that about 2-3 days ago she began to experience a sore throat. She states that her throat was 10/10 at the beginning but is now 5/10. She reports that with her sore throat she has had some difficulty swallowing secondary to pain and noticed a red spot on her tonsils. Appointment available for tomorrow at [facility] office for evaluation. Patient agreeable with this time and location.    Reason for Disposition  [1] Sore throat is the only symptom AND [2] present > 48 hours  Answer Assessment - Initial Assessment Questions 1. ONSET: When did the throat start hurting? (Hours or days ago)      2-3 days 2. SEVERITY: How bad is the sore throat? (Scale 1-10; mild, moderate or severe)   - MILD (1-3):  Doesn't interfere with eating or normal activities.   - MODERATE (4-7): Interferes with eating some solids and normal activities.   - SEVERE (8-10):  Excruciating pain, interferes with most normal activities.   - SEVERE WITH DYSPHAGIA (10): Can't swallow liquids, drooling.     6/10 3. STREP EXPOSURE: Has there been any exposure to strep within the past week? If Yes, ask: What type of contact  occurred?      No 4.  VIRAL SYMPTOMS: Are there any symptoms of a cold, such as a runny nose, cough, hoarse voice or red eyes?      Yes, runny nose, nasal congestion  5. FEVER: Do you have a fever? If Yes, ask: What is your temperature, how was it measured, and when did it start?     No 6. PUS ON THE TONSILS: Is there pus on the tonsils in the back of your throat?     Red spot on tonsils  7. OTHER SYMPTOMS: Do you have any other symptoms? (e.g., difficulty breathing, headache, rash)     Stuffy nose, painful to swallow 8. PREGNANCY: Is there any chance you are pregnant? When was your last menstrual period?     No  Protocols used: Sore Throat-A-AH

## 2023-03-15 ENCOUNTER — Ambulatory Visit: Payer: Self-pay | Admitting: Nurse Practitioner

## 2023-03-15 ENCOUNTER — Ambulatory Visit: Payer: Medicaid Other | Admitting: Family

## 2023-03-15 DIAGNOSIS — J029 Acute pharyngitis, unspecified: Secondary | ICD-10-CM

## 2023-03-15 NOTE — Telephone Encounter (Signed)
 Copied from CRM [id number]. Topic: Clinical - Medical Advice >> Mar 15, 2023 10:55 AM Jenny Welch wrote: Reason for CRM: Patient called stating she has a sore throat, hurts to swallow she explained that she doesn't have the bump in back of her throat anymore but it is still bothering her had a appointment but is now unable to make the appointment wanted to know if the nurses could send her some type of antibiotic for her throat  Chief Complaint: Sore throat Symptoms: Congestion and cough Frequency: Since Sunday/Monday Pertinent Negatives: Patient denies fever and difficulty breathing Disposition: [] ED /[] Urgent Care (no appt availability in office) / [x] Appointment(In office/virtual)/ []  [place] [facility]/ [] Home Care/ [] Refused Recommended Disposition /[] [place] [facility]/ []  Follow-up with PCP Additional Notes: Patient called in complaining of a severe sore throat. Patient stated pain is a 10 when she wakes up in the morning, but it decreases throughout the day. Patient also reported congestion, green mucous, and cough. Patient denied difficulty breathing and fever. Patient stated that she could see a red bump on her tonsil earlier this week, but is has gone away. Advised patient to be seen by provider within 3 days. No availability in patient's current PCP office. Scheduled for tomorrow morning at [place] [facility]. Advised patient to take Tylenol or ibuprofen as needed for pain and to continue adequate fluid intake. Advised patient to call back for worsening symptoms. Patient complied.   Reason for Disposition  [1] Sore throat with cough/cold symptoms AND [2] present > 5 days  Answer Assessment - Initial Assessment Questions 1. ONSET: When did the throat start hurting? (Hours or days ago)      Patient states early this week- Sunday/Monday  2. SEVERITY: How bad is the sore throat? (Scale 1-10; mild, moderate or severe)   - MILD (1-3):  Doesn't interfere with eating or normal  activities.   - MODERATE (4-7): Interferes with eating some solids and normal activities.   - SEVERE (8-10):  Excruciating pain, interferes with most normal activities.   - SEVERE WITH DYSPHAGIA (10): Can't swallow liquids, drooling.     Patient rates pain at a 10 when she wakes up, decreased to 5 or 6 throughout day  3. STREP EXPOSURE: Has there been any exposure to strep within the past week? If Yes, ask: What type of contact occurred?      Denies  4.  VIRAL SYMPTOMS: Are there any symptoms of a cold, such as a runny nose, cough, hoarse voice or red eyes?      Congestion and cough  5. FEVER: Do you have a fever? If Yes, ask: What is your temperature, how was it measured, and when did it start?     Denies  6. PUS ON THE TONSILS: Is there pus on the tonsils in the back of your throat?     Red bump on tonsil, but went away yesterday  7. OTHER SYMPTOMS: Do you have any other symptoms? (e.g., difficulty breathing, headache, rash)     Congestion and green mucous, denies difficulty breathing  Protocols used: Sore Throat-A-AH

## 2023-03-16 ENCOUNTER — Telehealth (INDEPENDENT_AMBULATORY_CARE_PROVIDER_SITE_OTHER): Payer: Medicaid Other | Admitting: Nurse Practitioner

## 2023-03-16 ENCOUNTER — Telehealth: Payer: Self-pay | Admitting: Nurse Practitioner

## 2023-03-16 ENCOUNTER — Ambulatory Visit: Payer: Medicaid Other | Admitting: Family

## 2023-03-16 DIAGNOSIS — J329 Chronic sinusitis, unspecified: Secondary | ICD-10-CM | POA: Insufficient documentation

## 2023-03-16 MED ORDER — AMOXICILLIN-POT CLAVULANATE 875-125 MG PO TABS
1.0000 | ORAL_TABLET | Freq: Two times a day (BID) | ORAL | 0 refills | Status: DC
Start: 2023-03-16 — End: 2023-05-15

## 2023-03-16 MED ORDER — ALBUTEROL SULFATE HFA 108 (90 BASE) MCG/ACT IN AERS
2.0000 | INHALATION_SPRAY | Freq: Four times a day (QID) | RESPIRATORY_TRACT | 0 refills | Status: DC | PRN
Start: 2023-03-16 — End: 2023-11-12

## 2023-03-16 NOTE — Progress Notes (Signed)
   Established Patient Office Visit  An audio/visual tele-health visit was completed today for this patient. I connected with  Jenny Welch on 03/16/23 utilizing audio/visual technology and verified that I am speaking with the correct person using two identifiers. The patient was located at restaurant, and I was located at home during the encounter. I discussed the limitations of evaluation and management by telemedicine. The patient expressed understanding and agreed to proceed.   Subjective   Patient ID: Jenny Welch, female    DOB: 26-May-2000  Age: 23 y.o. MRN: [id number]  Chief Complaint  Patient presents with   Sinus Problem    Symptom onset about 4 weeks ago.  She reports that she has been congested and just not feeling well for about a month.  Recently sent was worsened and she is experiencing worsening congestion, productive cough, shortness of breath, wheezing, and sore throat.  She also visualized a white spot in the back of her throat and was concerned she may have strep throat.  Has not been exposed to any other sick contacts as far she knows.    Review of Systems  Constitutional:  Negative for chills and fever.  HENT:  Positive for congestion, sinus pain and sore throat.   Respiratory:  Positive for cough, shortness of breath and wheezing.   Neurological:  Positive for headaches.      Objective:     LMP 03/08/2023    Physical Exam Comprehensive physical exam not completed today as office visit was conducted remotely.  Overall patient appears generally well, she does sound congested and I was able to visualize the back of her throat slightly it does appear red I do not see any purulent drainage but due to limits in technology visualization of throat was somewhat reduced..  Patient was alert and oriented, and appeared to have appropriate judgment.   No results found for any visits on 03/16/23.    The ASCVD Risk score (Arnett DK, et al., 2019) failed to calculate for the  following reasons:   The 2019 ASCVD risk score is only valid for ages 65 to 31    Assessment & Plan:   Problem List Items Addressed This Visit       Respiratory   Sinusitis - Primary   Acute, seems to have been a prolonged course.  I am concerned that she may have secondary bacterial infection and possibly strep. Will treat with course of Augmentin  1 tablet twice daily x 10 days.  Patient's last menstrual cycle was 1 week ago when she is on oral contraceptive pill. Patient to follow-up if symptoms worsen or do not improve.      Relevant Medications   amoxicillin -clavulanate (AUGMENTIN ) 875-125 MG tablet   albuterol  (VENTOLIN  HFA) 108 (90 Base) MCG/ACT inhaler    Return if symptoms worsen or fail to improve.    Jenny FORBES Pereyra, NP

## 2023-03-16 NOTE — Telephone Encounter (Signed)
 Copied from CRM [id number]. Topic: Clinical - Medication Question >> Mar 16, 2023  9:28 AM Pinkey ORN wrote: Reason for CRM: Requesting Antibiotics >> Mar 16, 2023  9:30 AM Pinkey ORN wrote: Patient called stating her appointment for today was cancelled due to the weather, then mentioned she wanted to request some antibiotics.

## 2023-03-16 NOTE — Assessment & Plan Note (Signed)
 Acute, seems to have been a prolonged course.  I am concerned that she may have secondary bacterial infection and possibly strep. Will treat with course of Augmentin  1 tablet twice daily x 10 days.  Patient's last menstrual cycle was 1 week ago when she is on oral contraceptive pill. Patient to follow-up if symptoms worsen or do not improve.

## 2023-05-15 ENCOUNTER — Encounter: Payer: Self-pay | Admitting: Family Medicine

## 2023-05-15 ENCOUNTER — Ambulatory Visit: Payer: Self-pay | Admitting: Nurse Practitioner

## 2023-05-15 ENCOUNTER — Ambulatory Visit: Admitting: Family Medicine

## 2023-05-15 VITALS — BP 131/91 | HR 86 | Temp 97.5°F | Wt 174.2 lb

## 2023-05-15 DIAGNOSIS — J069 Acute upper respiratory infection, unspecified: Secondary | ICD-10-CM | POA: Diagnosis not present

## 2023-05-15 MED ORDER — AZITHROMYCIN 250 MG PO TABS
ORAL_TABLET | ORAL | 0 refills | Status: AC
Start: 2023-05-15 — End: 2023-05-20

## 2023-05-15 MED ORDER — PREDNISONE 10 MG PO TABS
ORAL_TABLET | ORAL | 0 refills | Status: AC
Start: 2023-05-15 — End: 2023-05-21

## 2023-05-15 NOTE — Telephone Encounter (Signed)
 Chief Complaint: cough, chest tightness, SOB with exertion, sore throat. Negative at home covid / flu test Saturday Symptoms: see above. Sore throat , pain with swallowing. No fever today chest tightness/ pain breathing in . Coughing spells productive green sputum  Frequency: last Wednesday  Pertinent Negatives: Patient denies chest pain with difficulty breathing no dizziness reported no sweating  Disposition: [] ED /[] Urgent Care (no appt availability in office) / [x] Appointment(In office/virtual)/ []  [place] [facility]/ [] Home Care/ [] Refused Recommended Disposition /[]  Mobile Bus/ []  Follow-up with PCP Additional Notes:   No appt with PCP. Unable to schedule appt in time for 1100 with other provider in office at 1050. Scheduled appt with another provider at [facility] today . Recommended if sx worsen go to UC /ED.     Copied from CRM [id number]. Topic: Clinical - Red Word Triage >> May 15, 2023 10:34 AM Mackie Pai E wrote: [facility] that prompted transfer to Nurse Triage: Patient started feeling sick on Wednesday 3/5, and symptoms have not improved. Chest tightness when coughing. Patient stated she had a 99.9 fever, along with a swollen throat and stated it is hard for her to swallow. Also experiencing coughing spells, with green phlegm that comes up. Reason for Disposition  [1] MILD difficulty breathing (e.g., minimal/no SOB at rest, SOB with walking, pulse <100) AND [2] still present when not coughing  Answer Assessment - Initial Assessment Questions 1. ONSET: "When did the cough begin?"      05/09/23 2. SEVERITY: "How bad is the cough today?"      Cough spells with chest tightness 3. SPUTUM: "Describe the color of your sputum" (none, dry cough; clear, white, yellow, green)     Green  4. HEMOPTYSIS: "Are you coughing up any blood?" If so ask: "How much?" (flecks, streaks, tablespoons, etc.)     na 5. DIFFICULTY BREATHING: "Are you having difficulty breathing?" If Yes,  ask: "How bad is it?" (e.g., mild, moderate, severe)    - MILD: No SOB at rest, mild SOB with walking, speaks normally in sentences, can lie down, no retractions, pulse < 100.    - MODERATE: SOB at rest, SOB with minimal exertion and prefers to sit, cannot lie down flat, speaks in phrases, mild retractions, audible wheezing, pulse 100-120.    - SEVERE: Very SOB at rest, speaks in single words, struggling to breathe, sitting hunched forward, retractions, pulse > 120      SOB with exertion 6. FEVER: "Do you have a fever?" If Yes, ask: "What is your temperature, how was it measured, and when did it start?"     na 7. CARDIAC HISTORY: "Do you have any history of heart disease?" (e.g., heart attack, congestive heart failure)      na 8. LUNG HISTORY: "Do you have any history of lung disease?"  (e.g., pulmonary embolus, asthma, emphysema)     na 9. PE RISK FACTORS: "Do you have a history of blood clots?" (or: recent major surgery, recent prolonged travel, bedridden)     na 10. OTHER SYMPTOMS: "Do you have any other symptoms?" (e.g., runny nose, wheezing, chest pain)       Chest tightness , SOB with exertion  coughing spells green sputum sore throat  11. PREGNANCY: "Is there any chance you are pregnant?" "When was your last menstrual period?"       na 12. TRAVEL: "Have you traveled out of the country in the last month?" (e.g., travel history, exposures)  na  Protocols used: Cough - Acute Productive-A-AH

## 2023-05-15 NOTE — Patient Instructions (Addendum)
-  Suspect symptoms started as a viral infection and suspect it has involved into secondary infection. -Prescribed Azithromycin 250mg  tablet, take 2 tablets today and 1 tablet 2-5th day.  -Prednisone 10mg , 6 day taper. Do not take NSAIDS while taking this medication. -Follow up if not improved.

## 2023-05-15 NOTE — Progress Notes (Signed)
 Acute Office Visit   Subjective:  Patient ID: Jenny Welch, female    DOB: 09-09-2000, 23 y.o.   MRN: [id number]  Chief Complaint  Patient presents with   Cough    Cough   Patient is present for an acute visit. She reports last Wednesday she started experiencing fever, fatigue, body aches, and  cough. She reports this symptoms improved except cough. Then, on Sunday her cough become worse, productive with mostly thick green sputum. Also, had a sore throat, shortness of breath, and chest pain from coughing, and ear pressure.  Denies fever, body aches, headaches, nausea, or vomiting.   She reports she took a home flu and covid test. Negative.   She has took Robitussin and Catering manager. Some relief.    Review of Systems  Respiratory:  Positive for cough.    See HPI above      Objective:   BP (!) 131/91   Pulse 86   Temp (!) 97.5 F (36.4 C)   Wt 174 lb 3.2 oz (79 kg)   LMP 05/06/2023 (Approximate)   SpO2 100%   Breastfeeding No   BMI 31.86 kg/m    Physical Exam Vitals reviewed.  Constitutional:      General: She is not in acute distress.    Appearance: Normal appearance. She is obese. She is not ill-appearing, toxic-appearing or diaphoretic.  HENT:     Head: Normocephalic and atraumatic.     Right Ear: Tympanic membrane, ear canal and external ear normal. There is no impacted cerumen.     Left Ear: Tympanic membrane, ear canal and external ear normal. There is no impacted cerumen.     Nose:     Right Sinus: No maxillary sinus tenderness or frontal sinus tenderness.     Left Sinus: No maxillary sinus tenderness or frontal sinus tenderness.  Eyes:     General:        Right eye: No discharge.        Left eye: No discharge.     Conjunctiva/sclera: Conjunctivae normal.  Cardiovascular:     Rate and Rhythm: Normal rate and regular rhythm.     Heart sounds: Normal heart sounds. No murmur heard.    No friction rub. No gallop.  Pulmonary:     Effort: Pulmonary  effort is normal. No respiratory distress.     Breath sounds: Normal breath sounds.  Musculoskeletal:        General: Normal range of motion.  Skin:    General: Skin is warm and dry.  Neurological:     General: No focal deficit present.     Mental Status: She is alert and oriented to person, place, and time. Mental status is at baseline.  Psychiatric:        Mood and Affect: Mood normal.        Behavior: Behavior normal.        Thought Content: Thought content normal.        Judgment: Judgment normal.       Assessment & Plan:  Upper respiratory tract infection, unspecified type -     Azithromycin; Take 2 tablets on day 1, then 1 tablet daily on days 2 through 5  Dispense: 6 tablet; Refill: 0 -     predniSONE; Take 6 tablets (60 mg total) by mouth daily with breakfast for 1 day, THEN 5 tablets (50 mg total) daily with breakfast for 1 day, THEN 4 tablets (40 mg total) daily with breakfast for 1 day,  THEN 3 tablets (30 mg total) daily with breakfast for 1 day, THEN 2 tablets (20 mg total) daily with breakfast for 1 day, THEN 1 tablet (10 mg total) daily with breakfast for 1 day.  Dispense: 21 tablet; Refill: 0  -Suspect symptoms started as a viral infection and suspect it has involved into secondary infection. -Prescribed Azithromycin 250mg  tablet, take 2 tablets today and 1 tablet 2-5th day.  -Prednisone 10mg , 6 day taper. Do not take NSAIDS while taking this medication. -Follow up if not improved.   Zandra Abts, NP

## 2023-05-24 ENCOUNTER — Ambulatory Visit: Payer: Medicaid Other | Admitting: Nurse Practitioner

## 2023-05-24 VITALS — BP 126/96 | HR 104 | Temp 98.3°F | Ht 62.0 in | Wt 176.0 lb

## 2023-05-24 DIAGNOSIS — M25562 Pain in left knee: Secondary | ICD-10-CM | POA: Diagnosis not present

## 2023-05-24 DIAGNOSIS — R3589 Other polyuria: Secondary | ICD-10-CM

## 2023-05-24 DIAGNOSIS — R4589 Other symptoms and signs involving emotional state: Secondary | ICD-10-CM | POA: Insufficient documentation

## 2023-05-24 DIAGNOSIS — L659 Nonscarring hair loss, unspecified: Secondary | ICD-10-CM | POA: Insufficient documentation

## 2023-05-24 MED ORDER — ESCITALOPRAM OXALATE 5 MG PO TABS: ORAL_TABLET | ORAL | 1 refills | Status: DC

## 2023-05-24 NOTE — Assessment & Plan Note (Signed)
 Likely related to hormonal changes with having given birth. Will check labs to rule out iron deficiency, thyroid abnormality, and vitamin B12 deficiency.

## 2023-05-24 NOTE — Assessment & Plan Note (Signed)
 Discussed treatment options Will trial Lexapro 10 mg daily.  Start with 5 mg by mouth daily x 1 week then increase to 2 tablets by mouth daily until follow-up.  Follow-up in 6 weeks to see how she is tolerating medication. Patient warned of risk of suicidal ideation she reports understanding.

## 2023-05-24 NOTE — Assessment & Plan Note (Signed)
 Acute Patient return to lab within the next week to have labs drawn, check A1c at that time.  Further recommendations may be made based upon the results.

## 2023-05-24 NOTE — Progress Notes (Signed)
 Established Patient Office Visit  Subjective   Patient ID: Jenny Welch, female    DOB: 2000-05-30  Age: 23 y.o. MRN: [id number]  Chief Complaint  Patient presents with   Anxiety    Patient arrives today for follow-up. Anxiety: Reports increased irritability anxiety and depressed mood.  Reports having been on antidepressant in the past.  Does not member the name of it.  She would like to discuss medication options at this time aimed at improving mood.  PHQ-9 score today 0, she denies suicidal ideation. Left knee pain: Has been present for a few weeks, it is intermittent but usually occurs daily.  Sometimes she feels that her knee will lock and she has a hard time fully extending it.  No recent history of trauma to the knee, no remote history of trauma to the knee. Hair loss: Is about 7 months postpartum, has been noticing hair thinning. Polyuria: Had gestational diabetes, has been feeling the need to urinate more frequently is concerned about blood sugars.    Review of Systems  Respiratory:  Negative for shortness of breath.   Cardiovascular:  Negative for chest pain.      Objective:     BP (!) 126/96   Pulse (!) 104   Temp 98.3 F (36.8 C)   Ht 5\' 2"  (1.575 m)   Wt 176 lb (79.8 kg)   LMP 05/06/2023 (Approximate)   SpO2 99%   BMI 32.19 kg/m  BP Readings from Last 3 Encounters:  05/24/23 (!) 126/96  05/15/23 (!) 131/91  11/24/22 120/80   Wt Readings from Last 3 Encounters:  05/24/23 176 lb (79.8 kg)  05/15/23 174 lb 3.2 oz (79 kg)  11/24/22 162 lb 6 oz (73.7 kg)        05/24/2023    4:34 PM 11/24/2022   11:39 AM 06/01/2022    2:11 PM  PHQ9 SCORE ONLY  PHQ-9 Total Score 0 0 0     Physical Exam Vitals reviewed.  Constitutional:      General: She is not in acute distress.    Appearance: Normal appearance.  HENT:     Head: Normocephalic and atraumatic.  Neck:     Vascular: No carotid bruit.  Cardiovascular:     Rate and Rhythm: Normal rate and regular  rhythm.     Pulses: Normal pulses.     Heart sounds: Normal heart sounds.  Pulmonary:     Effort: Pulmonary effort is normal.     Breath sounds: Normal breath sounds.  Musculoskeletal:     Right knee: Normal.     Left knee: Normal. No swelling, deformity, effusion, bony tenderness or crepitus. Normal range of motion. No tenderness.  Skin:    General: Skin is warm and dry.  Neurological:     General: No focal deficit present.     Mental Status: She is alert and oriented to person, place, and time.  Psychiatric:        Mood and Affect: Mood normal.        Behavior: Behavior normal.        Judgment: Judgment normal.      No results found for any visits on 05/24/23.    The ASCVD Risk score (Arnett DK, et al., 2019) failed to calculate for the following reasons:   The 2019 ASCVD risk score is only valid for ages 43 to 73    Assessment & Plan:   Problem List Items Addressed This Visit  Other   Depressed mood - Primary   Discussed treatment options Will trial Lexapro 10 mg daily.  Start with 5 mg by mouth daily x 1 week then increase to 2 tablets by mouth daily until follow-up.  Follow-up in 6 weeks to see how she is tolerating medication. Patient warned of risk of suicidal ideation she reports understanding.      Relevant Medications   escitalopram (LEXAPRO) 5 MG tablet   Hair loss   Likely related to hormonal changes with having given birth. Will check labs to rule out iron deficiency, thyroid abnormality, and vitamin B12 deficiency.      Relevant Orders   Iron   Ferritin   CBC   TSH   Vitamin B12   Acute pain of left knee   Etiology unclear Referral to sports medicine      Relevant Orders   Ambulatory referral to Sports Medicine   Polyuria   Acute Patient return to lab within the next week to have labs drawn, check A1c at that time.  Further recommendations may be made based upon the results.      Relevant Orders   Hemoglobin A1c   Urine Culture     Return in about 6 weeks (around 07/05/2023) for F/U with Julis Haubner.    Elenore Paddy, NP

## 2023-05-24 NOTE — Assessment & Plan Note (Signed)
 Etiology unclear Referral to sports medicine

## 2023-05-25 LAB — CBC
HCT: 42.8 % (ref 36.0–46.0)
Hemoglobin: 14.2 g/dL (ref 12.0–15.0)
MCHC: 33.2 g/dL (ref 30.0–36.0)
MCV: 85.4 fl (ref 78.0–100.0)
Platelets: 387 10*3/uL (ref 150.0–400.0)
RBC: 5.01 Mil/uL (ref 3.87–5.11)
RDW: 14.6 % (ref 11.5–15.5)
WBC: 10.6 10*3/uL — ABNORMAL HIGH (ref 4.0–10.5)

## 2023-05-25 LAB — TSH: TSH: 1.13 u[IU]/mL (ref 0.35–5.50)

## 2023-05-25 LAB — IRON: Iron: 116 ug/dL (ref 42–145)

## 2023-05-25 LAB — VITAMIN B12: Vitamin B-12: 451 pg/mL (ref 211–911)

## 2023-05-25 LAB — HEMOGLOBIN A1C: Hgb A1c MFr Bld: 5.4 % (ref 4.6–6.5)

## 2023-05-25 LAB — FERRITIN: Ferritin: 26.1 ng/mL (ref 10.0–291.0)

## 2023-05-26 LAB — URINE CULTURE

## 2023-05-30 ENCOUNTER — Telehealth: Payer: Self-pay | Admitting: Nurse Practitioner

## 2023-05-30 NOTE — Telephone Encounter (Signed)
Referral request send to PCP

## 2023-05-30 NOTE — Progress Notes (Unsigned)
    Aleen Sells D.[facility] [address] [place] [address] Phone: [phone]   Assessment and Plan:     There are no diagnoses linked to this encounter.  ***   Pertinent previous records reviewed include ***    Follow Up: ***     Subjective:   I, [place] Flett, am serving as a Neurosurgeon for Doctor Richardean Sale  Chief Complaint: left knee pain   HPI:   05/31/2023 Patient is a 23 year old female with left knee pain. Patient states   Relevant Historical Information: ***  Additional pertinent review of systems negative.   Current Outpatient Medications:    albuterol (VENTOLIN HFA) 108 (90 Base) MCG/ACT inhaler, Inhale 2 puffs into the lungs every 6 (six) hours as needed for wheezing or shortness of breath., Disp: 8 g, Rfl: 0   escitalopram (LEXAPRO) 5 MG tablet, Take 1 tablet by mouth daily x 7 days, then take 2 tablets by mouth daily., Disp: 60 tablet, Rfl: 1   norethindrone (MICRONOR) 0.35 MG tablet, Take 1 tablet by mouth daily., Disp: , Rfl:    Objective:     There were no vitals filed for this visit.    There is no height or weight on file to calculate BMI.    Physical Exam:    ***   Electronically signed by:  Aleen Sells D.[facility] 4:18 PM 05/30/23

## 2023-05-30 NOTE — Telephone Encounter (Signed)
 Copied from CRM [id number]. Topic: General - Other >> May 30, 2023  8:58 AM Fredrich Romans wrote: Reason for CRM: Patient called in to ask about her lab results.I relayed message to patient from Jorene Guest.Patient verbalized understanding and stated that she would like the referral to dermatology. No preference as to dermatologist.

## 2023-05-31 ENCOUNTER — Other Ambulatory Visit: Payer: Self-pay | Admitting: Nurse Practitioner

## 2023-05-31 ENCOUNTER — Ambulatory Visit: Admitting: Sports Medicine

## 2023-05-31 VITALS — BP 130/82 | HR 98 | Ht 62.0 in | Wt 176.0 lb

## 2023-05-31 DIAGNOSIS — M222X2 Patellofemoral disorders, left knee: Secondary | ICD-10-CM

## 2023-05-31 DIAGNOSIS — L659 Nonscarring hair loss, unspecified: Secondary | ICD-10-CM

## 2023-05-31 MED ORDER — MELOXICAM 15 MG PO TABS: 15.0000 mg | ORAL_TABLET | Freq: Every day | ORAL | 0 refills | Status: DC

## 2023-05-31 NOTE — Progress Notes (Signed)
 Referral to dermatology.

## 2023-05-31 NOTE — Patient Instructions (Signed)
-   Start meloxicam 15 mg daily x2 weeks.  If still having pain after 2 weeks, complete 3rd-week of NSAID. May use remaining NSAID as needed once daily for pain control.  Do not to use additional over-the-counter NSAIDs (ibuprofen, naproxen, Advil, Aleve, etc.) while taking prescription NSAIDs.  May use Tylenol [phone] mg 2 to 3 times a day for breakthrough pain. Knee HEP  Can try a patellofemoral knee brace when physically active  4 week follow up

## 2023-06-27 NOTE — Progress Notes (Unsigned)
    Ben Jackson D.[facility] [address] [place] [address] Phone: [phone]   Assessment and Plan:     There are no diagnoses linked to this encounter.  ***   Pertinent previous records reviewed include ***    Follow Up: ***     Subjective:   I, Jenny Welch, am serving as a Neurosurgeon for Doctor Ulysees Gander   Chief Complaint: left knee pain    HPI:    05/31/2023 Patient is a 23 year old female with left knee pain. Patient states pain for about a month. Left knee is having a aching sensation with painful popping. She has pain going up and down steps. Hx of cheerleading. Decreased ROM due pain. Ibu for the pain and that doesn't help much. No MOI. No radiating pain or numbness and tingling.    06/28/2023 Patient states   Relevant Historical Information: None pertinent  Additional pertinent review of systems negative.   Current Outpatient Medications:    albuterol  (VENTOLIN  HFA) 108 (90 Base) MCG/ACT inhaler, Inhale 2 puffs into the lungs every 6 (six) hours as needed for wheezing or shortness of breath., Disp: 8 g, Rfl: 0   escitalopram  (LEXAPRO ) 5 MG tablet, Take 1 tablet by mouth daily x 7 days, then take 2 tablets by mouth daily., Disp: 60 tablet, Rfl: 1   meloxicam  (MOBIC ) 15 MG tablet, Take 1 tablet (15 mg total) by mouth daily., Disp: 30 tablet, Rfl: 0   norethindrone (MICRONOR) 0.35 MG tablet, Take 1 tablet by mouth daily., Disp: , Rfl:    Objective:     There were no vitals filed for this visit.    There is no height or weight on file to calculate BMI.    Physical Exam:    ***   Electronically signed by:  Marshall Skeeter D.[facility] 7:58 AM 06/27/23

## 2023-06-28 ENCOUNTER — Ambulatory Visit: Admitting: Nurse Practitioner

## 2023-06-28 ENCOUNTER — Ambulatory Visit: Admitting: Sports Medicine

## 2023-06-28 VITALS — BP 132/78 | HR 107 | Ht 62.0 in | Wt 179.0 lb

## 2023-06-28 DIAGNOSIS — M222X2 Patellofemoral disorders, left knee: Secondary | ICD-10-CM | POA: Diagnosis not present

## 2023-06-28 MED ORDER — METHYLPREDNISOLONE 4 MG PO TBPK: ORAL_TABLET | ORAL | 0 refills | Status: DC

## 2023-06-28 NOTE — Patient Instructions (Signed)
 Prednisone  dos pak  Tylenol for day to day pain relief  Voltaren gel over areas of pain  Continue HEP  PT referral  4-6 week follow up

## 2023-07-04 ENCOUNTER — Ambulatory Visit: Admitting: Nurse Practitioner

## 2023-07-05 ENCOUNTER — Ambulatory Visit: Admitting: Nurse Practitioner

## 2023-07-05 VITALS — BP 132/86 | HR 87 | Temp 98.4°F | Ht 62.0 in | Wt 178.4 lb

## 2023-07-05 DIAGNOSIS — N939 Abnormal uterine and vaginal bleeding, unspecified: Secondary | ICD-10-CM

## 2023-07-05 DIAGNOSIS — R4589 Other symptoms and signs involving emotional state: Secondary | ICD-10-CM | POA: Diagnosis not present

## 2023-07-05 LAB — POCT URINE PREGNANCY: Preg Test, Ur: NEGATIVE

## 2023-07-05 MED ORDER — DESVENLAFAXINE SUCCINATE ER 25 MG PO TB24
25.0000 mg | ORAL_TABLET | Freq: Every day | ORAL | 1 refills | Status: DC
Start: 2023-07-05 — End: 2023-12-06

## 2023-07-05 NOTE — Assessment & Plan Note (Signed)
 Acute, resolved Likely related to missing a few of her oral contraceptive pills. We did discuss possibly doing transvaginal ultrasound for further evaluation, patient has elected to hold off and will reach out if she has any additional breakthrough bleeding at which point I would recommend moving forward with transvaginal pelvic ultrasound for further evaluation. Point-of-care pregnancy test today is negative.

## 2023-07-05 NOTE — Progress Notes (Signed)
 Established Patient Office Visit  Subjective   Patient ID: Jenny Welch, female    DOB: Feb 28, 2001  Age: 23 y.o. MRN: [id number]  Chief Complaint  Patient presents with   Depression    Depression: Patient arrives today for follow-up after starting Lexapro .  She reports side effects caused her to feel brain fog so she self discontinued it.  Would like to discuss other options.  She also mention she had an episode of breakthrough bleeding about 2 weeks ago.  She does report having missed about 3 of her birth control pills last month.  Bleeding has stopped, she did have some uterine cramping similar to menstrual cycle cramping but otherwise no pain or discomfort.  No new sexual partners, no known exposure to STDs.    ROS: see HPI    Objective:     BP 132/86   Pulse 87   Temp 98.4 F (36.9 C) (Temporal)   Ht 5\' 2"  (1.575 m)   Wt 178 lb 6 oz (80.9 kg)   SpO2 99%   BMI 32.63 kg/m    Physical Exam Vitals reviewed.  Constitutional:      General: She is not in acute distress.    Appearance: Normal appearance.  HENT:     Head: Normocephalic and atraumatic.  Neck:     Vascular: No carotid bruit.  Cardiovascular:     Rate and Rhythm: Normal rate and regular rhythm.     Pulses: Normal pulses.     Heart sounds: Normal heart sounds.  Pulmonary:     Effort: Pulmonary effort is normal.     Breath sounds: Normal breath sounds.  Skin:    General: Skin is warm and dry.  Neurological:     General: No focal deficit present.     Mental Status: She is alert and oriented to person, place, and time.  Psychiatric:        Mood and Affect: Mood normal.        Behavior: Behavior normal.        Judgment: Judgment normal.       05/24/2023    4:34 PM 11/24/2022   11:39 AM 06/01/2022    2:11 PM  PHQ9 SCORE ONLY  PHQ-9 Total Score 0 0 0     Results for orders placed or performed in visit on 07/05/23  POCT urine pregnancy  Result Value Ref Range   Preg Test, Ur Negative Negative       The ASCVD Risk score (Arnett DK, et al., 2019) failed to calculate for the following reasons:   The 2019 ASCVD risk score is only valid for ages 19 to 61    Assessment & Plan:   Problem List Items Addressed This Visit       Genitourinary   Abnormal uterine bleeding   Acute, resolved Likely related to missing a few of her oral contraceptive pills. We did discuss possibly doing transvaginal ultrasound for further evaluation, patient has elected to hold off and will reach out if she has any additional breakthrough bleeding at which point I would recommend moving forward with transvaginal pelvic ultrasound for further evaluation. Point-of-care pregnancy test today is negative.        Other   Depressed mood - Primary   Chronic Skin tear Lexapro  Pressure decision-making we will try this venlafaxine 25 mg by mouth daily, follow-up in 4 weeks. Potential side effects discussed including risk of suicidal ideation and what to do if this were to occur.  Relevant Medications   desvenlafaxine  (PRISTIQ ) 25 MG 24 hr tablet   Other Relevant Orders   POCT urine pregnancy (Completed)    Return in about 1 month (around 08/05/2023).    Zorita Hiss, NP

## 2023-07-05 NOTE — Assessment & Plan Note (Signed)
 Chronic Skin tear Lexapro  Pressure decision-making we will try this venlafaxine 25 mg by mouth daily, follow-up in 4 weeks. Potential side effects discussed including risk of suicidal ideation and what to do if this were to occur.

## 2023-07-25 NOTE — Progress Notes (Deleted)
    Jenny Welch D.[facility] [address] [place] [address] Phone: [phone]   Assessment and Plan:     There are no diagnoses linked to this encounter.  ***   Pertinent previous records reviewed include ***    Follow Up: ***     Subjective:   I, Jenny Welch, am serving as a Neurosurgeon for [name]   Chief Complaint: left knee pain    HPI:    05/31/2023 Patient is a 23 year old female with left knee pain. Patient states pain for about a month. Left knee is having a aching sensation with painful popping. She has pain going up and down steps. Hx of cheerleading. Decreased ROM due pain. Ibu for the pain and that doesn't help much. No MOI. No radiating pain or numbness and tingling.    06/28/2023 Patient states she has constant popping. Pain has decreased but has started to come back the past couple of weeks   07/26/2023 Patient states   Relevant Historical Information: None pertinent  Additional pertinent review of systems negative.   Current Outpatient Medications:    albuterol  (VENTOLIN  HFA) 108 (90 Base) MCG/ACT inhaler, Inhale 2 puffs into the lungs every 6 (six) hours as needed for wheezing or shortness of breath., Disp: 8 g, Rfl: 0   desvenlafaxine  (PRISTIQ ) 25 MG 24 hr tablet, Take 1 tablet (25 mg total) by mouth daily., Disp: 90 tablet, Rfl: 1   meloxicam  (MOBIC ) 15 MG tablet, Take 1 tablet (15 mg total) by mouth daily., Disp: 30 tablet, Rfl: 0   methylPREDNISolone  (MEDROL  DOSEPAK) 4 MG TBPK tablet, Take 6 tablets on day 1.  Take 5 tablets on day 2.  Take 4 tablets on day 3.  Take 3 tablets on day 4.  Take 2 tablets on day 5.  Take 1 tablet on day 6., Disp: 21 tablet, Rfl: 0   norethindrone (MICRONOR) 0.35 MG tablet, Take 1 tablet by mouth daily., Disp: , Rfl:    Objective:     There were no vitals filed for this visit.    There is no height or weight on file to calculate BMI.    Physical Exam:     ***   Electronically signed by:  Jenny Welch D.[facility] 7:48 AM 07/25/23

## 2023-07-26 ENCOUNTER — Ambulatory Visit: Admitting: Sports Medicine

## 2023-08-03 ENCOUNTER — Encounter: Payer: Self-pay | Admitting: Sports Medicine

## 2023-08-07 ENCOUNTER — Ambulatory Visit (HOSPITAL_BASED_OUTPATIENT_CLINIC_OR_DEPARTMENT_OTHER): Attending: Sports Medicine | Admitting: Physical Therapy

## 2023-08-07 NOTE — Therapy (Incomplete)
 OUTPATIENT PHYSICAL THERAPY LOWER EXTREMITY EVALUATION   Patient Name: Birdena Kingma MRN: [id number] DOB:31-Mar-2000, 23 y.o., female Today's Date: 08/07/2023  END OF SESSION:   Past Medical History:  Diagnosis Date   ADHD (attention deficit hyperactivity disorder)    Anxiety    Arrhythmia    Dysmenorrhea    Fatigue 10/13/2021   History of MRSA infection 10/13/2021   Hypertensive disorder 10/06/2021   dx in early pregnancy --> SIPE at 30 wga     Lactose intolerance    Migraine without aura and with status migrainosus, not intractable 07/06/2019   Pre-eclampsia 03/12/2021   S/P cesarean section 03/12/2021   Past Surgical History:  Procedure Laterality Date   NO PAST SURGERIES     Patient Active Problem List   Diagnosis Date Noted   Abnormal uterine bleeding 07/05/2023   Depressed mood 05/24/2023   Hair loss 05/24/2023   Acute pain of left knee 05/24/2023   Polyuria 05/24/2023   Sinusitis 03/16/2023   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   Encounter for hepatitis C screening test for low risk patient 11/24/2022   Maternal varicella, non-immune 04/05/2022   Encounter for general adult medical examination with abnormal findings 11/17/2021   Vitamin D  deficiency 11/17/2021   Hyperlipidemia 11/17/2021   Class 1 obesity without serious comorbidity with body mass index (BMI) of 32.0 to 32.9 in adult 10/13/2021   Abnormal cervical Papanicolaou smear 10/10/2021   Migraine without aura and with status migrainosus, not intractable 07/06/2019   Panic disorder 01/14/2018   Generalized anxiety disorder 01/14/2018    PCP: ***  REFERRING PROVIDER: ***  REFERRING DIAG: ***  THERAPY DIAG:  No diagnosis found.  Rationale for Evaluation and Treatment: {HABREHAB:27488}  ONSET DATE: ***  SUBJECTIVE:   SUBJECTIVE STATEMENT: ***  PERTINENT HISTORY: *** PAIN:  Are you having pain? {OPRCPAIN:27236}  PRECAUTIONS: {Therapy precautions:24002}  PRECAUTIONS:  None   RED FLAGS: None      WEIGHT BEARING RESTRICTIONS: No   FALLS:  Has patient fallen in last 6 months? No   LIVING ENVIRONMENT: Lives with: lives with their family Lives in: House/apartment Stairs: ***   OCCUPATION: ***   PLOF: Independent   PATIENT GOALS: ***     OBJECTIVE:  Note: Objective measures were completed at Evaluation unless otherwise noted.   DIAGNOSTIC FINDINGS:      PATIENT SURVEYS:  ***   COGNITION: Overall cognitive status: Within functional limits for tasks assessed                         SENSATION: WFL   EDEMA:  Very mild positive sweep on L    MUSCLE LENGTH: WNL   POSTURE: No Significant postural limitations   PALPATION: TTP of ***   LOWER EXTREMITY ROM: WNL   LOWER EXTREMITY MMT:   MMT Right eval Left eval  Hip flexion      Hip extension      Hip abduction    Hip adduction      Hip internal rotation      Hip external rotation      Knee flexion      Knee extension    Ankle dorsiflexion      Ankle plantarflexion      Ankle inversion      Ankle eversion       (Blank rows = not tested)   LOWER EXTREMITY SPECIAL TESTS:  Knee special tests: Anterior drawer test: ***, Posterior drawer test: ***,  McMurray's test: ***, Thessaly test: *** , Patellafemoral apprehension test: ***, and Step up/down test: ***    (***) Craig's test bilat   FUNCTIONAL TESTS:  Squat: pain bilaterally, quad dominant squat   Lateral step down: pain, weakness with frontal plane deviation   GAIT: Distance walked: 69ft Assistive device utilized: None Level of assistance: Complete Independence Comments: ***                                                                                                                                 TREATMENT DATE:         PATIENT EDUCATION:  Education details: MOI, diagnosis, prognosis, anatomy, exercise progression, DOMS expectations, muscle firing,  envelope of function, HEP, POC   Person educated:  Patient Education method: Explanation, Demonstration, Tactile cues, Verbal cues, and Handouts Education comprehension: verbalized understanding, returned demonstration, verbal cues required, and tactile cues required   HOME EXERCISE PROGRAM:    ASSESSMENT:   CLINICAL IMPRESSION: Patient is a 23 y.o. female who was seen today for physical therapy evaluation and treatment for c/c of L knee pain. 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 Pt's pain is *** sensitive and irritable with movement.  Pt's is more strength and capacity limited at this time as her knee extensor tissue tolerance is decreased with increase in sensitivity across the entire anterior knee. Ligamentous exam intact. Plan to continue with heavy knee extensor isometrics, quad strength, and return to plyos as tolerated at future sessions.  Consider addition of knee extensions, wall lunge holds, and slant board step downs at future sessions. Take HHD measurements at subsequent visits.  Pt would benefit from continued skilled therapy in order to reach goals and maximize functional bilat LE strength for full return to PLOF.      OBJECTIVE IMPAIRMENTS: decreased activity tolerance, decreased balance, decreased endurance, decreased mobility, decreased ROM, decreased strength, hypomobility, increased muscle spasms, impaired flexibility, improper body mechanics, postural dysfunction, and pain.    ACTIVITY LIMITATIONS: lifting, squatting, locomotion level, and dressing   PARTICIPATION LIMITATIONS: interpersonal relationship, community activity, occupation, and exercise   PERSONAL FACTORS: Past/current experiences and Time since onset of injury/illness/exacerbation are also affecting patient's functional outcome.    REHAB POTENTIAL: Good   CLINICAL DECISION MAKING: Stable/uncomplicated   EVALUATION COMPLEXITY: Low     GOALS:     SHORT TERM GOALS: Target date: 09/18/2023     Pt will become independent with HEP in order to  demonstrate synthesis of PT education.   Goal status: INITIAL   2. Pt will have an at least 9 pt improvement in LEFS measure in order to demonstrate MCID improvement in daily function.     Goal status: INITIAL   3. Pt will report at least 2 pt reduction on NPRS scale for pain in order to demonstrate functional improvement with household activity, self care, and ADL.   Goal status: INITIAL   LONG TERM GOALS: Target date: 10/30/2023  Pt  will become independent with final HEP in order to demonstrate synthesis of PT education.   Goal status: INITIAL   2.  Pt will be able to demonstrate 20% improvement in HHD strength in order to demonstrate functional improvement in LE function for safe return to exercise and dance. training.    Goal status: INITIAL   3.  Pt will be able to demonstrate full speed lateral movements without pain in order to demonstrate functional improvement in LE function for return to team practice.   Goal status: INITIAL   4.  Pt will be able to demonstrate ability to run/jump without pain in order to demonstrate functional improvement and tolerance to low level plyometric loading.    Goal status: INITIAL       PLAN:   PT FREQUENCY: 1-2x/week   PT DURATION: 12 weeks   PLANNED INTERVENTIONS: Therapeutic exercises, Therapeutic activity, Neuromuscular re-education, Balance training, Gait training, Patient/Family education, Self Care, Joint mobilization, Joint manipulation, Stair training, Aquatic Therapy, Dry Needling, Electrical stimulation, Spinal manipulation, Spinal mobilization, Cryotherapy, Moist heat, scar mobilization, Splintting, Taping, Vasopneumatic device, Traction, Ultrasound, Ionotophoresis 4mg /ml Dexamethasone, Manual therapy, and Re-evaluation   PLAN FOR NEXT SESSION: bilat knee extension strength*, hip strength     Silver Dross, PT 08/07/2023, 12:57 PM

## 2023-08-09 ENCOUNTER — Ambulatory Visit: Admitting: Nurse Practitioner

## 2023-08-14 ENCOUNTER — Ambulatory Visit (HOSPITAL_BASED_OUTPATIENT_CLINIC_OR_DEPARTMENT_OTHER): Admitting: Physical Therapy

## 2023-08-14 ENCOUNTER — Encounter (HOSPITAL_BASED_OUTPATIENT_CLINIC_OR_DEPARTMENT_OTHER): Payer: Self-pay

## 2023-08-14 NOTE — Therapy (Incomplete)
 OUTPATIENT PHYSICAL THERAPY LOWER EXTREMITY EVALUATION   Patient Name: Jenny Welch MRN: [id number] DOB:[birth year], 23 y.o., female Today's Date: 08/14/2023  END OF SESSION:   Past Medical History:  Diagnosis Date   ADHD (attention deficit hyperactivity disorder)    Anxiety    Arrhythmia    Dysmenorrhea    Fatigue 10/13/2021   History of MRSA infection 10/13/2021   Hypertensive disorder 10/06/2021   dx in early pregnancy --> SIPE at 30 wga     Lactose intolerance    Migraine without aura and with status migrainosus, not intractable 07/06/2019   Pre-eclampsia 03/12/2021   S/P cesarean section 03/12/2021   Past Surgical History:  Procedure Laterality Date   NO PAST SURGERIES     Patient Active Problem List   Diagnosis Date Noted   Abnormal uterine bleeding 07/05/2023   Depressed mood 05/24/2023   Hair loss 05/24/2023   Acute pain of left knee 05/24/2023   Polyuria 05/24/2023   Sinusitis 03/16/2023   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   Encounter for hepatitis C screening test for low risk patient 11/24/2022   Maternal varicella, non-immune 04/05/2022   Encounter for general adult medical examination with abnormal findings 11/17/2021   Vitamin D  deficiency 11/17/2021   Hyperlipidemia 11/17/2021   Class 1 obesity without serious comorbidity with body mass index (BMI) of 32.0 to 32.9 in adult 10/13/2021   Abnormal cervical Papanicolaou smear 10/10/2021   Migraine without aura and with status migrainosus, not intractable 07/06/2019   Panic disorder 01/14/2018   Generalized anxiety disorder 01/14/2018    PCP: Zorita Hiss, NP   REFERRING PROVIDER: Ulysees Gander, DO   REFERRING DIAG:  W09.8J1 (ICD-10-CM) - Patellofemoral pain syndrome of left knee      THERAPY DIAG:  No diagnosis found.  Rationale for Evaluation and Treatment: Rehabilitation  ONSET DATE: ***  SUBJECTIVE:   SUBJECTIVE STATEMENT: ***  PERTINENT HISTORY: *** PAIN:   Are you having pain? Yes: NPRS scale: *** Pain location: *** Pain description: *** Aggravating factors: *** Relieving factors: ***  PRECAUTIONS: None  RED FLAGS: {PT Red Flags:29287}   WEIGHT BEARING RESTRICTIONS: {Yes ***/No:24003}  FALLS:  Has patient fallen in last 6 months? {fallsyesno:27318}  LIVING ENVIRONMENT: Lives with: {OPRC lives with:25569::"lives with their family"} Lives in: {Lives in:25570} Stairs: {opstairs:27293} Has following equipment at home: {Assistive devices:23999}  OCCUPATION: ***  PLOF: {PLOF:24004}  PATIENT GOALS: ***   OBJECTIVE:  Note: Objective measures were completed at Evaluation unless otherwise noted.  DIAGNOSTIC FINDINGS: N/A  PATIENT SURVEYS:  LEFS  Extreme difficulty/unable (0), Quite a bit of difficulty (1), Moderate difficulty (2), Little difficulty (3), No difficulty (4) Survey date:    Any of your usual work, housework or school activities   2. Usual hobbies, recreational or sporting activities   3. Getting into/out of the bath   4. Walking between rooms   5. Putting on socks/shoes   6. Squatting    7. Lifting an object, like a bag of groceries from the floor   8. Performing light activities around your home   9. Performing heavy activities around your home   10. Getting into/out of a car   11. Walking 2 blocks   12. Walking 1 mile   13. Going up/down 10 stairs (1 flight)   14. Standing for 1 hour   15.  sitting for 1 hour   16. Running on even ground   17. Running on uneven ground   18. Making sharp turns  while running fast   19. Hopping    20. Rolling over in bed   Score total:  ***     COGNITION: Overall cognitive status: Within functional limits for tasks assessed                         SENSATION: WFL   EDEMA:  ***   MUSCLE LENGTH: WNL   POSTURE: No Significant postural limitations   PALPATION: TTP ***   LOWER EXTREMITY ROM: WNL   LOWER EXTREMITY MMT:   MMT Right eval Left eval  Hip  flexion    Hip extension    Hip abduction    Hip adduction    Hip internal rotation    Hip external rotation    Knee flexion    Knee extension    Ankle dorsiflexion    Ankle plantarflexion      Ankle inversion      Ankle eversion       (Blank rows = not tested)   LOWER EXTREMITY SPECIAL TESTS:  Knee special tests: Anterior drawer test: ***, Posterior drawer test: ***, McMurray's test: ***, Thessaly test: *** , Patellafemoral apprehension test: ***, and Step up/down test: ***    (-) Craig's test bilat   FUNCTIONAL TESTS:  Squat: pain bilaterally, quad dominant squat   Lateral step down: pain, weakness with frontal plane deviation   GAIT: Distance walked: 48ft Assistive device utilized: None Level of assistance: Complete Independence Comments: WNL   SLS:                                                                                                                               TREATMENT DATE:   6/10        PATIENT EDUCATION:  Education details: MOI, diagnosis, prognosis, anatomy, exercise progression, DOMS expectations, muscle firing,  envelope of function, HEP, POC   Person educated: Patient Education method: Explanation, Demonstration, Tactile cues, Verbal cues, and Handouts Education comprehension: verbalized understanding, returned demonstration, verbal cues required, and tactile cues required   HOME EXERCISE PROGRAM:    ASSESSMENT:   CLINICAL IMPRESSION: Patient is a 23 y.o. female who was seen today for physical therapy evaluation and treatment for c/c of L knee pain. Pt's s/s appear consistent with L PFPS. Pt's pain is highly sensitive and irritable with movement with L > R.  Pt's is more strength limited at this time as her knee extensor tissue tolerance is decreased with increase in sensitivity across the entire anterior knee. Ligamentous exam intact.  Plan to continue with heavy knee extensor isometrics, quad strength, and return to plyos as tolerated at  future sessions.  Consider addition of knee extensions, wall lunge holds, and slant board step downs at future sessions. Pt would benefit from continued skilled therapy in order to reach goals and maximize functional bilat LE strength for full return to PLOF.      OBJECTIVE IMPAIRMENTS: decreased  activity tolerance, decreased balance, decreased endurance, decreased mobility, decreased ROM, decreased strength, hypomobility, increased muscle spasms, impaired flexibility, improper body mechanics, postural dysfunction, and pain.    ACTIVITY LIMITATIONS: lifting, squatting, locomotion level, and dressing   PARTICIPATION LIMITATIONS: interpersonal relationship, community activity, occupation, and exercise   PERSONAL FACTORS: Past/current experiences and Time since onset of injury/illness/exacerbation are also affecting patient's functional outcome.    REHAB POTENTIAL: Good   CLINICAL DECISION MAKING: Stable/uncomplicated   EVALUATION COMPLEXITY: Low     GOALS:     SHORT TERM GOALS: Target date: 09/25/2023        Pt will become independent with HEP in order to demonstrate synthesis of PT education.   Goal status: INITIAL   2. Pt will have an at least 9 pt improvement in LEFS measure in order to demonstrate MCID improvement in daily function.     Goal status: INITIAL   3. Pt will report at least 2 pt reduction on NPRS scale for pain in order to demonstrate functional improvement with household activity, self care, and ADL.   Goal status: INITIAL   LONG TERM GOALS: Target date: 11/12/2023    Pt  will become independent with final HEP in order to demonstrate synthesis of PT education.   Goal status: INITIAL   2.  Pt will be able to demonstrate 20% improvement in HHD strength in order to demonstrate functional improvement in LE function for safe return to exercise and dance. training.    Goal status: INITIAL   3.  Pt will be able to demonstrate full speed lateral movements without  pain in order to demonstrate functional improvement in LE function for return to team practice.   Goal status: INITIAL   4.  Pt will be able to demonstrate ability to run/jump without pain in order to demonstrate functional improvement and tolerance to low level plyometric loading.    Goal status: INITIAL       PLAN:   PT FREQUENCY: 1-2x/week   PT DURATION: 12 weeks (likely D/C to AT at 8 wks)   PLANNED INTERVENTIONS: Therapeutic exercises, Therapeutic activity, Neuromuscular re-education, Balance training, Gait training, Patient/Family education, Self Care, Joint mobilization, Joint manipulation, Stair training, Aquatic Therapy, Dry Needling, Electrical stimulation, Spinal manipulation, Spinal mobilization, Cryotherapy, Moist heat, scar mobilization, Splintting, Taping, Vasopneumatic device, Traction, Ultrasound, Ionotophoresis 4mg /ml Dexamethasone, Manual therapy, and Re-evaluation   PLAN FOR NEXT SESSION: bilat knee extension strength*, hip strength     Silver Dross PT, DPT 08/14/23 12:49 PM   Silver Dross, PT 08/14/2023, 12:49 PM

## 2023-08-21 ENCOUNTER — Encounter (HOSPITAL_BASED_OUTPATIENT_CLINIC_OR_DEPARTMENT_OTHER): Admitting: Physical Therapy

## 2023-08-28 ENCOUNTER — Encounter (HOSPITAL_BASED_OUTPATIENT_CLINIC_OR_DEPARTMENT_OTHER): Admitting: Physical Therapy

## 2023-09-27 ENCOUNTER — Ambulatory Visit: Admitting: Nurse Practitioner

## 2023-10-01 ENCOUNTER — Ambulatory Visit: Payer: Self-pay

## 2023-10-01 NOTE — Telephone Encounter (Signed)
 FYI Only or Action Required?: FYI only for provider.  Patient was last seen in primary care on 07/05/2023 by Elnor Lauraine BRAVO, NP.  Called Nurse Triage reporting Abdominal Pain.  Symptoms began x 1.5 weeks ago.  Interventions attempted: OTC medications: Tums.  Symptoms are: intermittent epigastric abdominal pain 2/10 at present, bloating, rectal bleeding (red streak of blood on toilet tissue x1 episode) unchanged.  Triage Disposition: See Physician Within 24 Hours  Patient/caregiver understands and will follow disposition?: Yes            Copied from CRM [id number]. Topic: Clinical - Red Word Triage >> Oct 01, 2023  1:43 PM Lavanda D wrote: Red Word that prompted transfer to Nurse Triage: Patient has been having pain across stomach, feels like squeezing/cramping pain. She has had it for about 1.5 weeks. No other symptoms with the pain. The pain is constant at times but other times it comes and goes. She has the pain right now as we speak, 2/10 but at it's peak it can be around an 8/10. Reason for Disposition  [1] MODERATE pain (e.g., interferes with normal activities) AND [2] pain comes and goes (cramps) AND [3] present > 24 hours  (Exception: Pain with Vomiting or Diarrhea - see that Guideline.)  Answer Assessment - Initial Assessment Questions 1. LOCATION: Where does it hurt?      Epigastric, sometimes across the entire abdomen.  2. RADIATION: Does the pain shoot anywhere else? (e.g., chest, back)     No.  3. ONSET: When did the pain begin? (e.g., minutes, hours or days ago)      1.5 weeks.  4. SUDDEN: Gradual or sudden onset?     Gradual.  5. PATTERN Does the pain come and go, or is it constant?     Comes and goes, she states this morning is was constant.  6. SEVERITY: How bad is the pain?  (e.g., Scale 1-10; mild, moderate, or severe)     Cramping, twisting sensation. 2/10 and she states it peaks at 8/10.  7. RECURRENT SYMPTOM: Have you ever had this type  of stomach pain before? If Yes, ask: When was the last time? and What happened that time?      No.  8. CAUSE: What do you think is causing the stomach pain? (e.g., gallstones, recent abdominal surgery)     Unsure.  9. RELIEVING/AGGRAVATING FACTORS: What makes it better or worse? (e.g., antacids, bending or twisting motion, bowel movement)     She states its worse when she feels hungry or if she lies on her stomach.  10. OTHER SYMPTOMS: Do you have any other symptoms? (e.g., back pain, diarrhea, fever, urination pain, vomiting)       Rectal bleeding (1 episode yesterday, a streak of bright red blood on toilet tissue, none in stool or toilet), bloating. Patient denies nausea, vomiting, fevers, lower back pain, diarrhea, constipation, pain with urination.  11. PREGNANCY: Is there any chance you are pregnant? When was your last menstrual period?       LMP: 09/07/23.  Protocols used: Abdominal Pain - Female-A-AH

## 2023-10-01 NOTE — Progress Notes (Deleted)
   Acute Office Visit  Subjective:     Patient ID: Jenny Welch, female    DOB: Jul 02, 2000, 23 y.o.   MRN: [id number]  No chief complaint on file.   HPI  Discussed the use of AI scribe software for clinical note transcription with the patient, who gave verbal consent to proceed.  History of Present Illness      ROS Per HPI      Objective:    There were no vitals taken for this visit.   Physical Exam Vitals and nursing note reviewed.  Constitutional:      General: She is not in acute distress.    Appearance: Normal appearance. She is normal weight.  HENT:     Head: Normocephalic and atraumatic.     Right Ear: External ear normal.     Left Ear: External ear normal.     Nose: Nose normal.     Mouth/Throat:     Mouth: Mucous membranes are moist.     Pharynx: Oropharynx is clear.  Eyes:     Extraocular Movements: Extraocular movements intact.     Pupils: Pupils are equal, round, and reactive to light.  Cardiovascular:     Rate and Rhythm: Normal rate and regular rhythm.     Pulses: Normal pulses.     Heart sounds: Normal heart sounds.  Pulmonary:     Effort: Pulmonary effort is normal. No respiratory distress.     Breath sounds: Normal breath sounds. No wheezing, rhonchi or rales.  Musculoskeletal:        General: Normal range of motion.     Cervical back: Normal range of motion.     Right lower leg: No edema.     Left lower leg: No edema.  Lymphadenopathy:     Cervical: No cervical adenopathy.  Neurological:     General: No focal deficit present.     Mental Status: She is alert and oriented to person, place, and time.  Psychiatric:        Mood and Affect: Mood normal.        Thought Content: Thought content normal.     No results found for any visits on 10/02/23.      Assessment & Plan:   Assessment and Plan Assessment & Plan      No orders of the defined types were placed in this encounter.    No orders of the defined types were placed in  this encounter.   No follow-ups on file.  Corean LITTIE Ku, FNP

## 2023-10-02 ENCOUNTER — Ambulatory Visit: Admitting: Family Medicine

## 2023-10-02 DIAGNOSIS — R1013 Epigastric pain: Secondary | ICD-10-CM

## 2023-10-25 ENCOUNTER — Ambulatory Visit (INDEPENDENT_AMBULATORY_CARE_PROVIDER_SITE_OTHER): Admitting: Family Medicine

## 2023-10-25 ENCOUNTER — Encounter: Payer: Self-pay | Admitting: Family Medicine

## 2023-10-25 VITALS — BP 130/84 | HR 84 | Temp 98.3°F | Ht 62.0 in | Wt 188.0 lb

## 2023-10-25 DIAGNOSIS — K581 Irritable bowel syndrome with constipation: Secondary | ICD-10-CM

## 2023-10-25 DIAGNOSIS — R1013 Epigastric pain: Secondary | ICD-10-CM

## 2023-10-25 LAB — CBC WITH DIFFERENTIAL/PLATELET
Basophils Absolute: 0.1 K/uL (ref 0.0–0.1)
Basophils Relative: 0.6 % (ref 0.0–3.0)
Eosinophils Absolute: 0.3 K/uL (ref 0.0–0.7)
Eosinophils Relative: 3 % (ref 0.0–5.0)
HCT: 44.2 % (ref 36.0–46.0)
Hemoglobin: 14.8 g/dL (ref 12.0–15.0)
Lymphocytes Relative: 29.5 % (ref 12.0–46.0)
Lymphs Abs: 2.9 K/uL (ref 0.7–4.0)
MCHC: 33.4 g/dL (ref 30.0–36.0)
MCV: 88.4 fl (ref 78.0–100.0)
Monocytes Absolute: 0.7 K/uL (ref 0.1–1.0)
Monocytes Relative: 7.6 % (ref 3.0–12.0)
Neutro Abs: 5.8 K/uL (ref 1.4–7.7)
Neutrophils Relative %: 59.3 % (ref 43.0–77.0)
Platelets: 373 K/uL (ref 150.0–400.0)
RBC: 4.99 Mil/uL (ref 3.87–5.11)
RDW: 13.5 % (ref 11.5–15.5)
WBC: 9.7 K/uL (ref 4.0–10.5)

## 2023-10-25 LAB — COMPREHENSIVE METABOLIC PANEL WITH GFR
ALT: 20 U/L (ref 0–35)
AST: 19 U/L (ref 0–37)
Albumin: 4.5 g/dL (ref 3.5–5.2)
Alkaline Phosphatase: 47 U/L (ref 39–117)
BUN: 8 mg/dL (ref 6–23)
CO2: 26 meq/L (ref 19–32)
Calcium: 9.2 mg/dL (ref 8.4–10.5)
Chloride: 102 meq/L (ref 96–112)
Creatinine, Ser: 0.71 mg/dL (ref 0.40–1.20)
GFR: 119.97 mL/min (ref >60.00)
Glucose, Bld: 84 mg/dL (ref 70–99)
Potassium: 3.8 meq/L (ref 3.5–5.1)
Sodium: 137 meq/L (ref 135–145)
Total Bilirubin: 0.6 mg/dL (ref 0.2–1.2)
Total Protein: 7.6 g/dL (ref 6.0–8.3)

## 2023-10-25 LAB — LIPASE: Lipase: 29 U/L (ref 11.0–59.0)

## 2023-10-25 MED ORDER — FAMOTIDINE 20 MG PO TABS: 20.0000 mg | ORAL_TABLET | Freq: Two times a day (BID) | ORAL | 1 refills | Status: DC

## 2023-10-25 MED ORDER — LINACLOTIDE 72 MCG PO CAPS: 72.0000 ug | ORAL_CAPSULE | Freq: Every day | ORAL | 3 refills | Status: AC

## 2023-10-25 NOTE — Progress Notes (Signed)
 Acute Office Visit  Subjective:     Patient ID: Jenny Welch, female    DOB: 10-08-2000, 23 y.o.   MRN: [id number]  Chief Complaint  Patient presents with   Acute Visit    Stomach pains, first noticed about 3 weeks ago. Has tried gas x and tums    HPI  Discussed the use of AI scribe software for clinical note transcription with the patient, who gave verbal consent to proceed.  History of Present Illness Jenny Welch is a 23 year old female who presents with abdominal pain and constipation.  Abdominal pain - Pain localized primarily across the midsection, sometimes radiating to the side and occasionally higher in the abdomen - Described as soreness that can become excruciating, associated with hot flashes - Pain sometimes alleviated by sitting, worsened by standing - No associated vomiting, diarrhea, or melena - Over-the-counter medications (Gas-X, Tums) provide relief for gas but do not alleviate the pain - Heating pad provides some relief  Constipation and bowel habits - Irregular bowel movements with frequent urges but persistent sensation of constipation - Bowel movements typically occur once or twice daily, usually in the morning after coffee - Occasional bright red blood on wiping, likely related to hemorrhoids or hard stool - No diarrhea  Rectal bleeding - Bright red blood on wiping associated with bowel movements - Likely related to hemorrhoids or hard stool  Family history of gastrointestinal disorders - Family history of irritable bowel syndrome (IBS) and diverticulitis     ROS Per HPI      Objective:    BP 130/84 (BP Location: Left Arm, Patient Position: Sitting)   Pulse 84   Temp 98.3 F (36.8 C) (Temporal)   Ht 5' 2 (1.575 m)   Wt 188 lb (85.3 kg)   SpO2 100%   BMI 34.39 kg/m    Physical Exam Vitals and nursing note reviewed.  Constitutional:      General: She is not in acute distress.    Appearance: Normal appearance.  HENT:     Head:  Normocephalic and atraumatic.     Right Ear: External ear normal.     Left Ear: External ear normal.     Nose: Nose normal.     Mouth/Throat:     Mouth: Mucous membranes are moist.     Pharynx: Oropharynx is clear.  Eyes:     Extraocular Movements: Extraocular movements intact.     Pupils: Pupils are equal, round, and reactive to light.  Cardiovascular:     Rate and Rhythm: Normal rate and regular rhythm.     Pulses: Normal pulses.     Heart sounds: Normal heart sounds.  Pulmonary:     Effort: Pulmonary effort is normal. No respiratory distress.     Breath sounds: Normal breath sounds. No wheezing, rhonchi or rales.  Abdominal:     General: There is no distension.     Palpations: There is no mass.     Tenderness: There is abdominal tenderness. There is no guarding or rebound.     Hernia: No hernia is present.     Comments: Epigastric tenderness, mild mid abdominal tenderness  Musculoskeletal:        General: Normal range of motion.     Cervical back: Normal range of motion.     Right lower leg: No edema.     Left lower leg: No edema.  Lymphadenopathy:     Cervical: No cervical adenopathy.  Neurological:     General: No  focal deficit present.     Mental Status: She is alert and oriented to person, place, and time.  Psychiatric:        Mood and Affect: Mood normal.        Thought Content: Thought content normal.     No results found for any visits on 10/25/23.      Assessment & Plan:   Assessment and Plan Assessment & Plan Irritable bowel syndrome with constipation and abdominal pain Abdominal pain and constipation suggest IBS with constipation. Pain likely due to colonic spasms exacerbated by constipation. Differential includes IBS, constipation, and less likely diverticulitis. - Prescribe Linzess  at low dose for constipation - Advise Miralax if constipation persists, mixed with warm beverage. - Schedule follow-up in one month to assess treatment  effectiveness.  Gastroesophageal reflux disease with upper abdominal pain Upper abdominal pain and burning after eating consistent with GERD.  Unlikely ulcer due to absence of black or tarry stools. - Prescribe Pepcid  twice daily with food. - Provide educational materials on dietary modifications. - Order H. pylori test to rule out bacterial infection.  Rectal bleeding likely secondary to constipation Bright red blood on toilet paper likely due to hemorrhoids or anal fissures from hard stools associated with constipation. No blood in stool or toilet. - CBC, CMP, lipase     Orders Placed This Encounter  Procedures   CBC w/Diff   Comp Met (CMET)   Lipase   H. pylori breath test     Meds ordered this encounter  Medications   famotidine  (PEPCID ) 20 MG tablet    Sig: Take 1 tablet (20 mg total) by mouth 2 (two) times daily.    Dispense:  60 tablet    Refill:  1   linaclotide  (LINZESS ) 72 MCG capsule    Sig: Take 1 capsule (72 mcg total) by mouth daily before breakfast.    Dispense:  30 capsule    Refill:  3    Return in about 4 weeks (around 11/22/2023) for f/u abdominal pain.  Corean LITTIE Ku, FNP

## 2023-10-25 NOTE — Patient Instructions (Addendum)
 I have sent in Pepcid  for you to take twice a day with food. This will help with upper abdominal pain  I have sent in Linzess  for you to take one tablet once daily. If you don't need it every day, you may skip the days you don't need it.   Follow up with me in one month for evaluation of medication effectiveness.

## 2023-10-26 ENCOUNTER — Ambulatory Visit: Payer: Self-pay | Admitting: Family Medicine

## 2023-11-01 ENCOUNTER — Ambulatory Visit: Admitting: Nurse Practitioner

## 2023-11-12 ENCOUNTER — Other Ambulatory Visit: Payer: Self-pay | Admitting: Nurse Practitioner

## 2023-11-12 DIAGNOSIS — J329 Chronic sinusitis, unspecified: Secondary | ICD-10-CM

## 2023-11-12 NOTE — Telephone Encounter (Unsigned)
 Copied from CRM #[id number]. Topic: Clinical - Medication Refill >> Nov 12, 2023  3:49 PM Paige D wrote: Medication:  albuterol  (VENTOLIN  HFA) 108 (90 Base) MCG/ACT inhaler   Has the patient contacted their pharmacy? No (Agent: If no, request that the patient contact the pharmacy for the refill. If patient does not wish to contact the pharmacy document the reason why and proceed with request.) (Agent: If yes, when and what did the pharmacy advise?)  This is the patient's preferred pharmacy:   [id number] [place], [place] - [address] [place] [address] Phone: [phone] Fax:  Is this the correct pharmacy for this prescription? Yes If no, delete pharmacy and type the correct one.   Has the prescription been filled recently? No  Is the patient out of the medication? Yes  Has the patient been seen for an appointment in the last year OR does the patient have an upcoming appointment? Yes  Can we respond through MyChart? No prefers call   Agent: Please be advised that Rx refills may take up to 3 business days. We ask that you follow-up with your pharmacy.

## 2023-11-14 MED ORDER — ALBUTEROL SULFATE HFA 108 (90 BASE) MCG/ACT IN AERS: 2.0000 | INHALATION_SPRAY | Freq: Four times a day (QID) | RESPIRATORY_TRACT | 0 refills | Status: DC | PRN

## 2023-11-16 ENCOUNTER — Other Ambulatory Visit: Payer: Self-pay | Admitting: Family Medicine

## 2023-11-16 DIAGNOSIS — R1013 Epigastric pain: Secondary | ICD-10-CM

## 2023-11-20 ENCOUNTER — Ambulatory Visit: Payer: Medicaid Other | Admitting: Dermatology

## 2023-11-20 ENCOUNTER — Encounter: Payer: Self-pay | Admitting: Dermatology

## 2023-11-20 VITALS — BP 140/91 | HR 80

## 2023-11-20 DIAGNOSIS — L918 Other hypertrophic disorders of the skin: Secondary | ICD-10-CM

## 2023-11-20 DIAGNOSIS — L650 Telogen effluvium: Secondary | ICD-10-CM | POA: Diagnosis not present

## 2023-11-20 MED ORDER — SAFETY SEAL MISCELLANEOUS MISC: 1.0000 | Freq: Every morning | 8 refills | Status: AC

## 2023-11-20 NOTE — Patient Instructions (Addendum)
 Date: Tue Nov 20 2023  Hello Abbigayle,  Thank you for visiting today. Here is a summary of the key instructions:  - Medications:   - Use Minoxidil topical treatment every morning for 6 months   - Take Viviscal vitamin supplement   - Take collagen supplement (Vital Protein brand recommended)  - Follow-up:   - Return in 6 months to check hair growth  - Other Instructions:   - Check with your insurance for coverage using CPT code 82889 for benign destruction of L91.8   - After-visit summary will be available by tomorrow  Please reach out if you have any questions or concerns.  Warm regards,  Dr. [facility]on [facility]   Call your insurance to see if it covers CPT: 17110 for  Diagnosis: L91.8          Important Information   Due to recent changes in healthcare laws, you may see results of your pathology and/or laboratory studies on MyChart before the doctors have had a chance to review them. We understand that in some cases there may be results that are confusing or concerning to you. Please understand that not all results are received at the same time and often the doctors may need to interpret multiple results in order to provide you with the best plan of care or course of treatment. Therefore, we ask that you please give us  2 business days to thoroughly review all your results before contacting the office for clarification. Should we see a critical lab result, you will be contacted sooner.     If You Need Anything After Your Visit   If you have any questions or concerns for your doctor, please call our main line at [phone]. If no one answers, please leave a voicemail as directed and we will return your call as soon as possible. Messages left after 4 pm will be answered the following business day.    You may also send us  a message via MyChart. We typically respond to MyChart messages within 1-2 business days.  For prescription refills, please ask your pharmacy to contact  our office. Our fax number is [phone].  If you have an urgent issue when the clinic is closed that cannot wait until the next business day, you can page your doctor at the number below.     Please note that while we do our best to be available for urgent issues outside of office hours, we are not available 24/7.    If you have an urgent issue and are unable to reach us , you may choose to seek medical care at your doctor's office, retail clinic, urgent care center, or emergency room.   If you have a medical emergency, please immediately call 911 or go to the emergency department. In the event of inclement weather, please call our main line at [phone] for an update on the status of any delays or closures.  Dermatology Medication Tips: Please keep the boxes that topical medications come in in order to help keep track of the instructions about where and how to use these. Pharmacies typically print the medication instructions only on the boxes and not directly on the medication tubes.   If your medication is too expensive, please contact our office at [phone] or send us  a message through MyChart.    We are unable to tell what your co-pay for medications will be in advance as this is different depending on your insurance coverage. However, we may be able to find  a substitute medication at lower cost or fill out paperwork to get insurance to cover a needed medication.    If a prior authorization is required to get your medication covered by your insurance company, please allow us  1-2 business days to complete this process.   Drug prices often vary depending on where the prescription is filled and some pharmacies may offer cheaper prices.   The website www.goodrx.com contains coupons for medications through different pharmacies. The prices here do not account for what the cost may be with help from insurance (it may be cheaper with your insurance), but the website can give you the price  if you did not use any insurance.  - You can print the associated coupon and take it with your prescription to the pharmacy.  - You may also stop by our office during regular business hours and pick up a GoodRx coupon card.  - If you need your prescription sent electronically to a different pharmacy, notify our office through [facility] or by phone at [phone]

## 2023-11-20 NOTE — Progress Notes (Signed)
   New Patient Visit   Subjective  Jenny Welch is a 23 y.o. female who presents for the following: Skin Tag and hair loss   Patient states she has skin tag located at the Left Breast that she would like to have examined. Patient reports the areas have been there for several years. She reports the areas are bothersome. She reports the area can be itchy. Patient rates irritation 4 out of 10. Patient reports she has not previously been treated for these areas. Patient denies Hx of bx. Patient denies family history of skin cancer(s).  Patient states she has hair loss located at the scalp that she would like to have examined. Patient reports the areas have been there for 2 years (happens after her pregnancies). She reports the areas are not bothersome. She states that her hair will come out in clumps. Patient reports she has not previously been treated for these areas. Patient denies Hx of bx.   The following portions of the chart were reviewed this encounter and updated as appropriate: medications, allergies, medical history  Review of Systems:  No other skin or systemic complaints except as noted in HPI or Assessment and Plan.  Objective  Well appearing patient in no apparent distress; mood and affect are within normal limits.  A focused examination was performed of the following areas: Scalp and left breast  Relevant exam findings are noted in the Assessment and Plan.                Assessment & Plan   1. Telogen Effluvium - Assessment: Patient presents with hair thinning and clumpy hair loss that began postpartum after the birth of her child [employer] and has persisted since. Positive hair pull test observed during examination. 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 This diagnosis is consistent with the typical onset of 2-3 months postpartum and can persist for 6-8 months. -  Plan:    Prescribe topical minoxidil 8% (compounded) to be applied every morning for six months    Recommend over-the-counter Viviscal vitamin supplement    Recommend over-the-counter collagen supplement (suggested brand: Vital Protein)    Educate patient on expected timeline: 3 months to observe baby hairs, 6 months for full effect    Follow-up appointment in 6 months to assess hair growth progress  2. Skin Tag on Breast - Assessment: Patient has a large skin tag on the breast. This is a benign growth that can be easily treated in the office. - Plan:     Reassured pt skin tags are benign but can be irritated / inflamed.  We will Monitor for changes    patient preferred to check with insurance for coverage using CPT code 82889 for benign destruction of irritated skin tag  Follow-up in 6 months to assess hair growth progress.    No follow-ups on file.  I, Jetta Ager, am acting as Neurosurgeon for [employer], DO.  Documentation: I have reviewed the above documentation for accuracy and completeness, and I agree with the above.  Delon Lenis, DO

## 2023-12-06 ENCOUNTER — Ambulatory Visit: Payer: Self-pay | Admitting: Nurse Practitioner

## 2023-12-06 ENCOUNTER — Ambulatory Visit: Admitting: Nurse Practitioner

## 2023-12-06 VITALS — BP 124/78 | HR 103 | Temp 98.4°F | Ht 62.0 in | Wt 186.2 lb

## 2023-12-06 DIAGNOSIS — E66811 Obesity, class 1: Secondary | ICD-10-CM | POA: Diagnosis not present

## 2023-12-06 DIAGNOSIS — Z6834 Body mass index (BMI) 34.0-34.9, adult: Secondary | ICD-10-CM

## 2023-12-06 DIAGNOSIS — Z6832 Body mass index (BMI) 32.0-32.9, adult: Secondary | ICD-10-CM

## 2023-12-06 DIAGNOSIS — R5383 Other fatigue: Secondary | ICD-10-CM

## 2023-12-06 DIAGNOSIS — N912 Amenorrhea, unspecified: Secondary | ICD-10-CM

## 2023-12-06 DIAGNOSIS — J40 Bronchitis, not specified as acute or chronic: Secondary | ICD-10-CM | POA: Diagnosis not present

## 2023-12-06 DIAGNOSIS — F411 Generalized anxiety disorder: Secondary | ICD-10-CM

## 2023-12-06 LAB — TSH: TSH: 0.77 u[IU]/mL (ref 0.35–5.50)

## 2023-12-06 LAB — T3, FREE: T3, Free: 3.7 pg/mL (ref 2.3–4.2)

## 2023-12-06 LAB — VITAMIN B12: Vitamin B-12: 506 pg/mL (ref 211–911)

## 2023-12-06 LAB — HCG, SERUM, QUALITATIVE: Preg, Serum: NEGATIVE

## 2023-12-06 LAB — VITAMIN D 25 HYDROXY (VIT D DEFICIENCY, FRACTURES): VITD: 17.83 ng/mL — ABNORMAL LOW (ref 30.00–100.00)

## 2023-12-06 LAB — T4, FREE: Free T4: 0.77 ng/dL (ref 0.60–1.60)

## 2023-12-06 LAB — HEMOGLOBIN A1C: Hgb A1c MFr Bld: 5.2 % (ref 4.6–6.5)

## 2023-12-06 MED ORDER — PROMETHAZINE-DM 6.25-15 MG/5ML PO SYRP: 5.0000 mL | ORAL_SOLUTION | Freq: Three times a day (TID) | ORAL | 0 refills | Status: DC | PRN

## 2023-12-06 MED ORDER — PREDNISONE 20 MG PO TABS: 40.0000 mg | ORAL_TABLET | Freq: Every day | ORAL | 0 refills | Status: DC

## 2023-12-06 NOTE — Assessment & Plan Note (Signed)
 Obesity with associated fatigue and difficulty losing weight Obesity with BMI 34.07, fatigue, and difficulty losing weight. Possible insulin resistance or thyroid  dysfunction considered. - Order thyroid  panel, A1c, vitamin B12, and vitamin D  levels. - Order HCG serum test to rule out pregnancy. - Discuss alternative contraception options with OB GYN. - Educate on calorie deficit diet (1100-1200 calories/day) and low glycemic index diet. - Consider referral to weight loss clinic if no improvement after 3 months.

## 2023-12-06 NOTE — Assessment & Plan Note (Signed)
 History of major depressive disorder, currently improved and not on therapy Previously treated with Pristiq . Mood improved, not interested in medication currently.

## 2023-12-06 NOTE — Patient Instructions (Signed)
 1100-1200 cal/day Drink 60-80 ounces of water/day Follow a low-glycemic index diet Exercise for 150+ minutes/week

## 2023-12-06 NOTE — Assessment & Plan Note (Signed)
 Labs ordered, further recommendations may be made based upon his results.

## 2023-12-06 NOTE — Assessment & Plan Note (Signed)
 Suspected acute bronchitis  Discussed symptomatic treatment and prednisone  risks. - Prescribe promethazine-dextromethorphan cough syrup every 8 hours as needed. - Prescribe prednisone  to have on hand if needed. - Instruct to avoid driving or operating heavy machinery after taking cough syrup. - Advise not to take medications until pregnancy is ruled out. - Use albuterol  inhaler as needed.

## 2023-12-06 NOTE — Progress Notes (Signed)
 Established Patient Office Visit  Subjective   Patient ID: Jenny Welch, female    DOB: [birth year]  Age: 23 y.o. MRN: [id number]  Chief Complaint  Patient presents with   Obesity    Discussed the use of AI scribe software for clinical note transcription with the patient, who gave verbal consent to proceed.  History of Present Illness Jenny Welch is a 23 year old female who presents with fatigue, weight gain, and cough.   Fatigue and weight gain  - Fatigue present despite regular exercise - Difficulty losing weight - Concern that norethindrone may contribute to weight gain -Current BMI 34.07  Menstrual irregularity - Amenorrhea with no recent menstrual period - Two negative pregnancy tests in the past week at home  Cough and respiratory symptoms - Persistent cough for the past 3-4 days - Green mucus production - Chest burning - Occasional shortness of breath - Difficulty taking deep breaths - No fever or muscle aches - Uses albuterol  inhaler as needed  Mood symptoms - Previously started on Pristiq  for mood issues - Discontinued Pristiq  as mental health has improved  History of gestational diabetes - History of gestational diabetes      ROS: see HPI    Objective:     BP 124/78   Pulse (!) 103   Temp 98.4 F (36.9 C) (Temporal)   Ht 5' 2 (1.575 m)   Wt 186 lb 4 oz (84.5 kg)   LMP 10/10/2023   SpO2 98%   BMI 34.07 kg/m  BP Readings from Last 3 Encounters:  12/06/23 124/78  11/20/23 (!) 140/91  10/25/23 130/84   Wt Readings from Last 3 Encounters:  12/06/23 186 lb 4 oz (84.5 kg)  10/25/23 188 lb (85.3 kg)  07/05/23 178 lb 6 oz (80.9 kg)      Physical Exam Vitals reviewed.  Constitutional:      General: She is not in acute distress.    Appearance: Normal appearance.  HENT:     Head: Normocephalic and atraumatic.  Cardiovascular:     Rate and Rhythm: Normal rate and regular rhythm.     Pulses: Normal pulses.     Heart sounds: Normal heart  sounds.  Pulmonary:     Effort: Pulmonary effort is normal.     Breath sounds: Normal breath sounds.  Skin:    General: Skin is warm and dry.  Neurological:     General: No focal deficit present.     Mental Status: She is alert and oriented to person, place, and time.  Psychiatric:        Mood and Affect: Mood normal.        Behavior: Behavior normal.        Judgment: Judgment normal.      No results found for any visits on 12/06/23.    The ASCVD Risk score (Arnett DK, et al., 2019) failed to calculate for the following reasons:   The 2019 ASCVD risk score is only valid for ages 36 to 57    Assessment & Plan:   Problem List Items Addressed This Visit       Respiratory   Bronchitis - Primary   Suspected acute bronchitis  Discussed symptomatic treatment and prednisone  risks. - Prescribe promethazine-dextromethorphan cough syrup every 8 hours as needed. - Prescribe prednisone  to have on hand if needed. - Instruct to avoid driving or operating heavy machinery after taking cough syrup. - Advise not to take medications until pregnancy is ruled out. - Use albuterol   inhaler as needed.      Relevant Medications   predniSONE  (DELTASONE ) 20 MG tablet   promethazine-dextromethorphan (PROMETHAZINE-DM) 6.25-15 MG/5ML syrup     Other   Generalized anxiety disorder   History of major depressive disorder, currently improved and not on therapy Previously treated with Pristiq . Mood improved, not interested in medication currently.      Obesity (BMI 30.0-34.9)   Obesity with associated fatigue and difficulty losing weight Obesity with BMI 34.07, fatigue, and difficulty losing weight. Possible insulin resistance or thyroid  dysfunction considered. - Order thyroid  panel, A1c, vitamin B12, and vitamin D  levels. - Order HCG serum test to rule out pregnancy. - Discuss alternative contraception options with OB GYN. - Educate on calorie deficit diet (1100-1200 calories/day) and low  glycemic index diet. - Consider referral to weight loss clinic if no improvement after 3 months.      Fatigue   Labs ordered, further recommendations may be made based upon his results       Relevant Orders   Hemoglobin A1c   hCG, serum, qualitative   TSH   T3, free   T4, free   Vitamin D  (25 hydroxy)   Vitamin B12   Anti-TPO Ab (RDL)   BMI 34.0-34.9,adult   Obesity with associated fatigue and difficulty losing weight Obesity with BMI 34.07, fatigue, and difficulty losing weight. Possible insulin resistance or thyroid  dysfunction considered. - Order thyroid  panel, A1c, vitamin B12, and vitamin D  levels. - Order HCG serum test to rule out pregnancy. - Discuss alternative contraception options with OB GYN. - Educate on calorie deficit diet (1100-1200 calories/day) and low glycemic index diet. - Consider referral to weight loss clinic if no improvement after 3 months.      Amenorrhea   Amenorrhea and abnormal uterine bleeding, evaluation ongoing Amenorrhea with negative home pregnancy tests. Possible PCOS or hormonal imbalances considered. - Order HCG serum test to rule out pregnancy. - Order thyroid  panel and A1c to evaluate for hormonal imbalances. - Discuss potential PCOS if irregular periods persist.     Assessment and Plan Assessment & Plan Obesity with associated fatigue and difficulty losing weight Obesity with BMI 34.07, fatigue, and difficulty losing weight. Possible insulin resistance or thyroid  dysfunction considered. - Order thyroid  panel, A1c, vitamin B12, and vitamin D  levels. - Order HCG serum test to rule out pregnancy. - Discuss alternative contraception options with OB GYN. - Educate on calorie deficit diet (1100-1200 calories/day) and low glycemic index diet. - Consider referral to weight loss clinic if no improvement after 3 months.  Suspected acute bronchitis  Discussed symptomatic treatment and prednisone  risks. - Prescribe  promethazine-dextromethorphan cough syrup every 8 hours as needed. - Prescribe prednisone  to have on hand if needed. - Instruct to avoid driving or operating heavy machinery after taking cough syrup. - Advise not to take medications until pregnancy is ruled out. - Use albuterol  inhaler as needed.  Amenorrhea and abnormal uterine bleeding, evaluation ongoing Amenorrhea with negative home pregnancy tests. Possible PCOS or hormonal imbalances considered. - Order HCG serum test to rule out pregnancy. - Order thyroid  panel and A1c to evaluate for hormonal imbalances. - Discuss potential PCOS if irregular periods persist.  History of major depressive disorder, currently improved and not on therapy Previously treated with Pristiq . Mood improved, not interested in medication currently.  Follow-Up Follow-up plan discussed for ongoing evaluation and management. - Follow up in 3 months for re-evaluation of weight management and other health concerns. - Contact with lab results via phone  due to MyChart access issues.    Return in about 3 months (around 03/07/2024) for F/U with Lulia Schriner.    Lauraine FORBES Pereyra, NP

## 2023-12-06 NOTE — Assessment & Plan Note (Signed)
 Amenorrhea and abnormal uterine bleeding, evaluation ongoing Amenorrhea with negative home pregnancy tests. Possible PCOS or hormonal imbalances considered. - Order HCG serum test to rule out pregnancy. - Order thyroid  panel and A1c to evaluate for hormonal imbalances. - Discuss potential PCOS if irregular periods persist.

## 2023-12-07 ENCOUNTER — Other Ambulatory Visit: Payer: Self-pay | Admitting: Nurse Practitioner

## 2023-12-07 DIAGNOSIS — E559 Vitamin D deficiency, unspecified: Secondary | ICD-10-CM

## 2023-12-07 MED ORDER — VITAMIN D (ERGOCALCIFEROL) 1.25 MG (50000 UNIT) PO CAPS: 50000.0000 [IU] | ORAL_CAPSULE | ORAL | 0 refills | Status: DC

## 2023-12-07 NOTE — Telephone Encounter (Signed)
Patient returned call. Relayed message to patient.

## 2023-12-13 LAB — ANTI-TPO AB (RDL): Anti-TPO Ab (RDL): <9 [IU]/mL (ref <9.0)

## 2024-02-26 ENCOUNTER — Ambulatory Visit: Admitting: Nurse Practitioner

## 2024-03-05 ENCOUNTER — Ambulatory Visit: Admitting: Internal Medicine

## 2024-03-05 ENCOUNTER — Other Ambulatory Visit (HOSPITAL_COMMUNITY): Payer: Self-pay

## 2024-03-05 ENCOUNTER — Telehealth: Payer: Self-pay

## 2024-03-05 ENCOUNTER — Encounter: Payer: Self-pay | Admitting: Internal Medicine

## 2024-03-05 VITALS — BP 140/96 | HR 86 | Temp 98.2°F | Resp 16 | Ht 62.0 in | Wt 187.4 lb

## 2024-03-05 DIAGNOSIS — F410 Panic disorder [episodic paroxysmal anxiety] without agoraphobia: Secondary | ICD-10-CM | POA: Diagnosis not present

## 2024-03-05 DIAGNOSIS — R0683 Snoring: Secondary | ICD-10-CM

## 2024-03-05 DIAGNOSIS — R079 Chest pain, unspecified: Secondary | ICD-10-CM | POA: Diagnosis not present

## 2024-03-05 DIAGNOSIS — G43719 Chronic migraine without aura, intractable, without status migrainosus: Secondary | ICD-10-CM | POA: Diagnosis not present

## 2024-03-05 DIAGNOSIS — I10 Essential (primary) hypertension: Secondary | ICD-10-CM | POA: Diagnosis not present

## 2024-03-05 LAB — BASIC METABOLIC PANEL WITH GFR
BUN: 7 mg/dL (ref 6–23)
CO2: 26 meq/L (ref 19–32)
Calcium: 8.9 mg/dL (ref 8.4–10.5)
Chloride: 106 meq/L (ref 96–112)
Creatinine, Ser: 0.68 mg/dL (ref 0.40–1.20)
GFR: 122.58 mL/min
Glucose, Bld: 93 mg/dL (ref 70–99)
Potassium: 4.1 meq/L (ref 3.5–5.1)
Sodium: 138 meq/L (ref 135–145)

## 2024-03-05 LAB — URINALYSIS, ROUTINE W REFLEX MICROSCOPIC
Bilirubin Urine: NEGATIVE
Ketones, ur: NEGATIVE
Leukocytes,Ua: NEGATIVE
Nitrite: NEGATIVE
Specific Gravity, Urine: 1.025 (ref 1.000–1.030)
Total Protein, Urine: NEGATIVE
Urine Glucose: NEGATIVE
Urobilinogen, UA: 0.2 (ref 0.0–1.0)
pH: 6 (ref 5.0–8.0)

## 2024-03-05 MED ORDER — SERTRALINE HCL 25 MG PO TABS: 25.0000 mg | ORAL_TABLET | Freq: Every day | ORAL | 0 refills | Status: DC

## 2024-03-05 MED ORDER — INDAPAMIDE 1.25 MG PO TABS: 1.2500 mg | ORAL_TABLET | Freq: Every day | ORAL | 0 refills | Status: AC

## 2024-03-05 MED ORDER — NURTEC 75 MG PO TBDP: 1.0000 | ORAL_TABLET | ORAL | 0 refills | Status: AC

## 2024-03-05 NOTE — Telephone Encounter (Signed)
 Pharmacy Patient Advocate Encounter   Received notification from CoverMyMeds that prior authorization for Nurtec 75mg  tabs is required/requested.   Insurance verification completed.   The patient is insured through [employer].   Per test claim: PA required; PA submitted to above mentioned insurance via Latent Key/confirmation #/EOC [id number] Status is pending

## 2024-03-05 NOTE — Progress Notes (Signed)
 "  Subjective:  Patient ID: Jenny Welch, female    DOB: [birth year]  Age: 23 y.o. MRN: [id number]  CC: Migraine (Consistent for about 2 month at least 1 or 2 a week. Last one she had was yesterday. Patient states she has been taking OTC medication and drinking sodas to try and help. ) and Chest Pain (Last 1-2 months patient feel like she's getting a pain in her chest on her left side at night when she lays down. She also feels like she's had some palpitations also. Sometimes numbness and tingling down her left arm comes with the chest pain.)   HPI Jenny Welch presents for f/up   Discussed the use of AI scribe software for clinical note transcription with the patient, who gave verbal consent to proceed.  History of Present Illness Jenny Welch is a 23 year old female with a history of migraines and high blood pressure who presents with recurrent migraines and new onset chest pain.  She has experienced a recurrence of migraines, which she used to have years ago. The headaches are described as migraines located in the frontal area on both sides, accompanied by nausea but no vomiting. She has been using Advil and ibuprofen for relief but has not sought recent neurological consultation or medication.  She describes experiencing chest pain and heart pain for the past one to two months. The pain starts in the chest and radiates down the left side to her hand, accompanied by a tingling sensation similar to 'when your feet fall asleep'. She also reports heart palpitations occurring at any time of the day and shortness of breath during physical activity.  She has a history of high blood pressure, which is not currently being treated. She vapes frequently, with her last use being this morning. She is not on any medication that would raise her blood pressure and is not currently taking any form of contraception.  She experiences panic and anxiety attacks, particularly at night, which have returned in the past  month due to stress related to her children's father. She previously managed these with medication but is not currently on any treatment. She reports waking up in a panic and gasping for air, and she snores, which her parents have noticed.  Her last menstrual cycle started yesterday, and she is not currently sexually active. She has not received flu or COVID vaccines. She has had two C-sections in the past and no other major surgeries.     Outpatient Medications Prior to Visit  Medication Sig Dispense Refill   albuterol  (VENTOLIN  HFA) 108 (90 Base) MCG/ACT inhaler Inhale 2 puffs into the lungs every 6 (six) hours as needed for wheezing or shortness of breath. 8 g 0   linaclotide  (LINZESS ) 72 MCG capsule Take 1 capsule (72 mcg total) by mouth daily before breakfast. (Patient taking differently: Take 72 mcg by mouth as needed.) 30 capsule 3   Safety Seal Miscellaneous MISC Apply 1 Application topically in the morning. Medication Name: Minoxidil solution 8% 30 mL 8   triamcinolone cream (KENALOG) 0.1 % See Admin Instructions. PLEASE SEE ATTACHED FOR DETAILED DIRECTIONS     famotidine  (PEPCID ) 20 MG tablet TAKE 1 TABLET BY MOUTH TWICE A DAY 180 tablet 1   ibuprofen (ADVIL) 800 MG tablet      norethindrone (MICRONOR) 0.35 MG tablet Take 1 tablet by mouth daily.     predniSONE  (DELTASONE ) 20 MG tablet Take 2 tablets (40 mg total) by mouth daily with breakfast. 10 tablet 0  promethazine -dextromethorphan (PROMETHAZINE -DM) 6.25-15 MG/5ML syrup Take 5 mLs by mouth 3 (three) times daily as needed for cough. 118 mL 0   Vitamin D , Ergocalciferol , (DRISDOL ) 1.25 MG (50000 UNIT) CAPS capsule Take 1 capsule (50,000 Units total) by mouth every 7 (seven) days. 8 capsule 0   No facility-administered medications prior to visit.    ROS Review of Systems  Constitutional:  Negative for appetite change, chills, diaphoresis, fatigue and fever.  HENT: Negative.  Negative for congestion.   Eyes: Negative.    Respiratory:  Negative for cough, chest tightness, shortness of breath and wheezing.   Cardiovascular:  Positive for chest pain and palpitations. Negative for leg swelling.  Gastrointestinal:  Negative for abdominal pain, constipation, diarrhea, nausea and vomiting.  Endocrine: Negative.   Genitourinary: Negative.  Negative for difficulty urinating.  Musculoskeletal: Negative.  Negative for neck pain.  Skin: Negative.  Negative for color change and rash.  Neurological:  Positive for headaches. Negative for dizziness and numbness.  Hematological:  Negative for adenopathy. Does not bruise/bleed easily.  Psychiatric/Behavioral:  Negative for behavioral problems, confusion, decreased concentration, dysphoric mood, self-injury, sleep disturbance and suicidal ideas. The patient is nervous/anxious.     Objective:  BP (!) 140/96 (BP Location: Left Arm, Patient Position: Sitting, Cuff Size: Normal) Comment: BP (R) 140/98  Pulse 86   Temp 98.2 F (36.8 C) (Oral)   Resp 16   Ht 5' 2 (1.575 m)   Wt 187 lb 6.4 oz (85 kg)   LMP 03/04/2024   SpO2 99%   BMI 34.28 kg/m   BP Readings from Last 3 Encounters:  03/05/24 (!) 140/96  12/06/23 124/78  11/20/23 (!) 140/91    Wt Readings from Last 3 Encounters:  03/05/24 187 lb 6.4 oz (85 kg)  12/06/23 186 lb 4 oz (84.5 kg)  10/25/23 188 lb (85.3 kg)    Physical Exam Vitals reviewed.  HENT:     Nose: Nose normal.     Mouth/Throat:     Mouth: Mucous membranes are moist.  Eyes:     General: No scleral icterus.    Conjunctiva/sclera: Conjunctivae normal.  Cardiovascular:     Rate and Rhythm: Normal rate and regular rhythm.     Heart sounds: Normal heart sounds, S1 normal and S2 normal. No murmur heard.    No friction rub. No gallop.     Comments: EKG--- NSR, 84 bpm No LVH, Q waves, or ST/T wave changes  Pulmonary:     Effort: Pulmonary effort is normal.     Breath sounds: No stridor. No wheezing, rhonchi or rales.  Abdominal:      General: Abdomen is flat. Bowel sounds are normal.     Palpations: There is no mass.     Tenderness: There is no abdominal tenderness. There is no guarding.     Hernia: No hernia is present.  Musculoskeletal:     Cervical back: Neck supple.     Right lower leg: No edema.     Left lower leg: No edema.  Lymphadenopathy:     Cervical: No cervical adenopathy.  Skin:    General: Skin is warm and dry.     Findings: No lesion.  Neurological:     General: No focal deficit present.     Mental Status: She is alert. Mental status is at baseline.  Psychiatric:        Mood and Affect: Mood normal.        Behavior: Behavior normal.     Lab  Results  Component Value Date   WBC 9.7 10/25/2023   HGB 14.8 10/25/2023   HCT 44.2 10/25/2023   PLT 373.0 10/25/2023   GLUCOSE 93 03/05/2024   CHOL 159 10/14/2021   TRIG 99.0 10/14/2021   HDL 37.00 (L) 10/14/2021   LDLCALC 102 (H) 10/14/2021   ALT 20 10/25/2023   AST 19 10/25/2023   NA 138 03/05/2024   K 4.1 03/05/2024   CL 106 03/05/2024   CREATININE 0.68 03/05/2024   BUN 7 03/05/2024   CO2 26 03/05/2024   TSH 0.77 12/06/2023   INR 1.0 11/26/2021   HGBA1C 5.2 12/06/2023    DG Chest Port 1 View Result Date: 11/26/2021 CLINICAL DATA:  Sepsis EXAM: PORTABLE CHEST 1 VIEW COMPARISON:  11/16/2017 FINDINGS: The heart size and mediastinal contours are within normal limits. Both lungs are clear. The visualized skeletal structures are unremarkable. IMPRESSION: No active disease. Electronically Signed   By: Dorethia Molt M.D.   On: 11/26/2021 04:10    The ASCVD Risk score (Arnett DK, et al., 2019) failed to calculate for the following reasons:   The 2019 ASCVD risk score is only valid for ages 52 to 66   * - Cholesterol units were assumed   Assessment & Plan:  Loud snoring -     Pulmonary Visit  Hypertension, unspecified type- EKG is negative for LVH but BP is not at goal. -     EKG 12-Lead -     Aldosterone + renin activity w/ ratio;  Future -     DRUG MONITORING, PANEL 7 WITH CONFIRMATION, URINE; Future -     Urinalysis, Routine w reflex microscopic; Future -     Basic metabolic panel with GFR; Future -     AMB Referral [facility] Care Management -     Indapamide; Take 1 tablet (1.25 mg total) by mouth daily.  Dispense: 90 tablet; Refill: 0  Intractable chronic migraine without aura and without status migrainosus -     Nurtec; Take 1 tablet (75 mg total) by mouth every other day.  Dispense: 48 tablet; Refill: 0  Chest pain at rest -     CT CORONARY MORPH W/CTA COR W/SCORE W/CA W/CM &/OR WO/CM; Future  Severe anxiety with panic- Will start an SSRI. -     Sertraline HCl; Take 1 tablet (25 mg total) by mouth daily.  Dispense: 30 tablet; Refill: 0     Follow-up: Return in about 3 months (around 06/03/2024).  Debby Molt, MD "

## 2024-03-05 NOTE — Patient Instructions (Signed)
 Hypertension, Adult High blood pressure (hypertension) is when the force of blood pumping through the arteries is too strong. The arteries are the blood vessels that carry blood from the heart throughout the body. Hypertension forces the heart to work harder to pump blood and may cause arteries to become narrow or stiff. Untreated or uncontrolled hypertension can lead to a heart attack, heart failure, a stroke, kidney disease, and other problems. A blood pressure reading consists of a higher number over a lower number. Ideally, your blood pressure should be below 120/80. The first ("top") number is called the systolic pressure. It is a measure of the pressure in your arteries as your heart beats. The second ("bottom") number is called the diastolic pressure. It is a measure of the pressure in your arteries as the heart relaxes. What are the causes? The exact cause of this condition is not known. There are some conditions that result in high blood pressure. What increases the risk? Certain factors may make you more likely to develop high blood pressure. Some of these risk factors are under your control, including: Smoking. Not getting enough exercise or physical activity. Being overweight. Having too much fat, sugar, calories, or salt (sodium) in your diet. Drinking too much alcohol. Other risk factors include: Having a personal history of heart disease, diabetes, high cholesterol, or kidney disease. Stress. Having a family history of high blood pressure and high cholesterol. Having obstructive sleep apnea. Age. The risk increases with age. What are the signs or symptoms? High blood pressure may not cause symptoms. Very high blood pressure (hypertensive crisis) may cause: Headache. Fast or irregular heartbeats (palpitations). Shortness of breath. Nosebleed. Nausea and vomiting. Vision changes. Severe chest pain, dizziness, and seizures. How is this diagnosed? This condition is diagnosed by  measuring your blood pressure while you are seated, with your arm resting on a flat surface, your legs uncrossed, and your feet flat on the floor. The cuff of the blood pressure monitor will be placed directly against the skin of your upper arm at the level of your heart. Blood pressure should be measured at least twice using the same arm. Certain conditions can cause a difference in blood pressure between your right and left arms. If you have a high blood pressure reading during one visit or you have normal blood pressure with other risk factors, you may be asked to: Return on a different day to have your blood pressure checked again. Monitor your blood pressure at home for 1 week or longer. If you are diagnosed with hypertension, you may have other blood or imaging tests to help your health care provider understand your overall risk for other conditions. How is this treated? This condition is treated by making healthy lifestyle changes, such as eating healthy foods, exercising more, and reducing your alcohol intake. You may be referred for counseling on a healthy diet and physical activity. Your health care provider may prescribe medicine if lifestyle changes are not enough to get your blood pressure under control and if: Your systolic blood pressure is above 130. Your diastolic blood pressure is above 80. Your personal target blood pressure may vary depending on your medical conditions, your age, and other factors. Follow these instructions at home: Eating and drinking  Eat a diet that is high in fiber and potassium, and low in sodium, added sugar, and fat. An example of this eating plan is called the DASH diet. DASH stands for Dietary Approaches to Stop Hypertension. To eat this way: Eat  plenty of fresh fruits and vegetables. Try to fill one half of your plate at each meal with fruits and vegetables. Eat whole grains, such as whole-wheat pasta, brown rice, or whole-grain bread. Fill about one  fourth of your plate with whole grains. Eat or drink low-fat dairy products, such as skim milk or low-fat yogurt. Avoid fatty cuts of meat, processed or cured meats, and poultry with skin. Fill about one fourth of your plate with lean proteins, such as fish, chicken without skin, beans, eggs, or tofu. Avoid pre-made and processed foods. These tend to be higher in sodium, added sugar, and fat. Reduce your daily sodium intake. Many people with hypertension should eat less than 1,500 mg of sodium a day. Do not drink alcohol if: Your health care provider tells you not to drink. You are pregnant, may be pregnant, or are planning to become pregnant. If you drink alcohol: Limit how much you have to: 0-1 drink a day for women. 0-2 drinks a day for men. Know how much alcohol is in your drink. In the [place] one drink equals one 12 oz bottle of beer (355 mL), one 5 oz glass of wine (148 mL), or one 1 oz glass of hard liquor (44 mL). Lifestyle  Work with your health care provider to maintain a healthy body weight or to lose weight. Ask what an ideal weight is for you. Get at least 30 minutes of exercise that causes your heart to beat faster (aerobic exercise) most days of the week. Activities may include walking, swimming, or biking. Include exercise to strengthen your muscles (resistance exercise), such as Pilates or lifting weights, as part of your weekly exercise routine. Try to do these types of exercises for 30 minutes at least 3 days a week. Do not use any products that contain nicotine or tobacco. These products include cigarettes, chewing tobacco, and vaping devices, such as e-cigarettes. If you need help quitting, ask your health care provider. Monitor your blood pressure at home as told by your health care provider. Keep all follow-up visits. This is important. Medicines Take over-the-counter and prescription medicines only as told by your health care provider. Follow directions carefully. Blood  pressure medicines must be taken as prescribed. Do not skip doses of blood pressure medicine. Doing this puts you at risk for problems and can make the medicine less effective. Ask your health care provider about side effects or reactions to medicines that you should watch for. Contact a health care provider if you: Think you are having a reaction to a medicine you are taking. Have headaches that keep coming back (recurring). Feel dizzy. Have swelling in your ankles. Have trouble with your vision. Get help right away if you: Develop a severe headache or confusion. Have unusual weakness or numbness. Feel faint. Have severe pain in your chest or abdomen. Vomit repeatedly. Have trouble breathing. These symptoms may be an emergency. Get help right away. Call 911. Do not wait to see if the symptoms will go away. Do not drive yourself to the hospital. Summary Hypertension is when the force of blood pumping through your arteries is too strong. If this condition is not controlled, it may put you at risk for serious complications. Your personal target blood pressure may vary depending on your medical conditions, your age, and other factors. For most people, a normal blood pressure is less than 120/80. Hypertension is treated with lifestyle changes, medicines, or a combination of both. Lifestyle changes include losing weight, eating a healthy,  low-sodium diet, exercising more, and limiting alcohol. This information is not intended to replace advice given to you by your health care provider. Make sure you discuss any questions you have with your health care provider. Document Revised: 12/28/2020 Document Reviewed: 12/28/2020 Elsevier Patient Education  2024 [employer].

## 2024-03-07 ENCOUNTER — Telehealth: Payer: Self-pay | Admitting: *Deleted

## 2024-03-07 NOTE — Telephone Encounter (Signed)
 Pharmacy Patient Advocate Encounter  Received notification from [employer] that Prior Authorization for Nurtec 75mg  tabs has been DENIED.  See denial reason below. No denial letter attached in CMM. Will attach denial letter to Media tab once received.   PA #/Case ID/Reference #: [id number]

## 2024-03-07 NOTE — Progress Notes (Signed)
 Care Guide Pharmacy Note  03/07/2024 Name: Jenny Welch MRN: [id number] DOB: 07-03-2000  Referred By: Elnor Lauraine BRAVO, NP Reason for referral: Complex Care Management (Outreach to schedule referral with pharmacist )   Jenny Welch is a 24 y.o. year old female who is a primary care patient of Elnor Lauraine BRAVO, NP.  Jenny Welch was referred to the pharmacist for assistance related to: HTN  Successful contact was made with the patient to discuss pharmacy services including being ready for the pharmacist to call at least 5 minutes before the scheduled appointment time and to have medication bottles and any blood pressure readings ready for review. The patient agreed to meet with the pharmacist via telephone visit on 03/18/2024  Jenny Welch, CMA [place]  [facility], [facility] Guide Direct Dial: [phone]  Fax: [phone] Website: [email or web address]

## 2024-03-08 LAB — DRUG MONITORING, PANEL 7 WITH CONFIRMATION, URINE
6 Acetylmorphine: NEGATIVE ng/mL
Alcohol Metabolites: NEGATIVE ng/mL
Amphetamines: NEGATIVE ng/mL
Barbiturates: NEGATIVE ng/mL
Benzodiazepines: NEGATIVE ng/mL
Cocaine Metabolite: NEGATIVE ng/mL
Creatinine: 172.1 mg/dL
Marijuana Metabolite: NEGATIVE ng/mL
Methadone Metabolite: NEGATIVE ng/mL
Opiates: NEGATIVE ng/mL
Oxidant: NEGATIVE ug/mL
Oxycodone: NEGATIVE ng/mL
pH: 5 (ref 4.5–9.0)

## 2024-03-08 LAB — DM TEMPLATE

## 2024-03-09 ENCOUNTER — Ambulatory Visit: Payer: Self-pay | Admitting: Internal Medicine

## 2024-03-10 LAB — ALDOSTERONE + RENIN ACTIVITY W/ RATIO
ALDO / PRA Ratio: 1.5 ratio (ref 0.9–28.9)
Aldosterone: 2 ng/dL
Renin Activity: 1.37 ng/mL/h (ref 0.25–5.82)

## 2024-03-12 ENCOUNTER — Ambulatory Visit

## 2024-03-12 VITALS — BP 126/84 | HR 93 | Temp 97.7°F | Ht 63.0 in | Wt 181.8 lb

## 2024-03-12 DIAGNOSIS — E66812 Obesity, class 2: Secondary | ICD-10-CM | POA: Diagnosis not present

## 2024-03-12 DIAGNOSIS — R0683 Snoring: Secondary | ICD-10-CM

## 2024-03-12 DIAGNOSIS — R0609 Other forms of dyspnea: Secondary | ICD-10-CM

## 2024-03-12 DIAGNOSIS — J4530 Mild persistent asthma, uncomplicated: Secondary | ICD-10-CM

## 2024-03-12 DIAGNOSIS — F17290 Nicotine dependence, other tobacco product, uncomplicated: Secondary | ICD-10-CM

## 2024-03-12 DIAGNOSIS — Z6832 Body mass index (BMI) 32.0-32.9, adult: Secondary | ICD-10-CM | POA: Diagnosis not present

## 2024-03-12 DIAGNOSIS — Z716 Tobacco abuse counseling: Secondary | ICD-10-CM

## 2024-03-12 DIAGNOSIS — K219 Gastro-esophageal reflux disease without esophagitis: Secondary | ICD-10-CM | POA: Diagnosis not present

## 2024-03-12 MED ORDER — BUDESONIDE-FORMOTEROL FUMARATE 80-4.5 MCG/ACT IN AERO: 2.0000 | INHALATION_SPRAY | Freq: Two times a day (BID) | RESPIRATORY_TRACT | 12 refills | Status: AC

## 2024-03-12 NOTE — Progress Notes (Signed)
 "  Pulmonology Office Visit   Subjective:  Patient ID: Jenny Welch, female    DOB: 2001-02-19  MRN: [id number]  Referred by: Joshua Debby CROME, MD  CC:  Chief Complaint  Patient presents with   Consult    Snoring, waking up at night gasping for air.  Also SOB walking up and down stairs.  Patient is a heavy vape user.    HPI Jenny Welch is a 24 y.o. female with Hypertension, migraines, IBS, obesity class I, anxiety  presents for evaluation of loud snoring.  Respective notes from provider reviewed as appropriate to gather relevant information for patient care.   Discussed the use of AI scribe software for clinical note transcription with the patient, who gave verbal consent to proceed.  History of Present Illness   Jenny Welch is a 24 year old female who presents with loud snoring and daytime fatigue. She was referred by her primary care physician for evaluation of loud snoring.  She experiences loud snoring significant enough to wake herself up at times. Although she does not have a bed partner to confirm episodes of apnea, she notes occasional dry mouth in the morning. Persistent daytime fatigue is present, and she describes herself as 'always tired'. Her sleep pattern includes going to bed between 9:30 and 10:30 PM, taking a while to fall asleep, and waking up three to four times a night, partly due to her young child. She wakes up around 6:00 to 6:30 AM and does not nap during the day.  She has a history of hypertension, first noted during her first pregnancy when she experienced severe preeclampsia. She is currently managing her blood pressure with Indapamide , which she reports is working well for her. She also has a history of low vitamin D  levels and has been on vitamin D  supplements, though she is currently out of them.  She experiences migraines that 'come and go' and has a history of anxiety. She previously took Zoloft , which caused bad dreams, leading her to discontinue it. No  restless legs, acting out dreams, or hallucinations upon waking or falling asleep.  She reports shortness of breath, particularly with exertion such as climbing stairs, and attributes some of this to her weight. She is a heavy vaper and has noticed increased shortness of breath and phlegm production, which is usually clear but sometimes green. She uses an albuterol  inhaler three to four times a day, which provides relief. She has a family history of asthma, as her father has asthma and MAC (Mycobacterium avium complex).  She experiences allergy symptoms such as a stuffy nose and sneezing, and has acid reflux for which she takes Prilosec as needed. She has not been on steroids for her breathing issues.      ASTHMA:  First diagnosed: non diagnosed.  FH: father.  Triggers: humidity. Times albuterol  used: albuterol  3-4 times/day.  Treatment: none.  Others: pregnancy- yes, allergy symptoms- yes , GERD- yes, OSA- possibly.    OSA history: HST.   Lung Health: Functional status: 20 min.  Smoking: vaping regularly. No smoking.  Occupational exposure/pets: no pets. Stay at home mom.  Occasional coffe and alcohol user.    PAP download compliance data: NA.  Encore/Airview Pressure: Hours of usage: Days used >4hr: Leak: AHI:  PRIOR TESTS and IMAGING: PSG/HSAT: ordered.   PFT: ordered.      03/12/2024   10:00 AM  Results of the Epworth flowsheet  Sitting and reading 0  Watching TV 2  Sitting, inactive in a  public place (e.g. a theatre or a meeting) 0  As a passenger in a car for an hour without a break 1  Lying down to rest in the afternoon when circumstances permit 2  Sitting and talking to someone 0  Sitting quietly after a lunch without alcohol 1  In a car, while stopped for a few minutes in traffic 0  Total score 6    Allergies: Milk-related compounds and Other Current Medications[1] Past Medical History:  Diagnosis Date   ADHD (attention deficit hyperactivity disorder)     Anxiety    Arrhythmia    Dysmenorrhea    Fatigue 10/13/2021   History of MRSA infection 10/13/2021   Hypertensive disorder 10/06/2021   dx in early pregnancy --> SIPE at 30 wga     Lactose intolerance    Migraine without aura and with status migrainosus, not intractable 07/06/2019   Pre-eclampsia 03/12/2021   S/P cesarean section 03/12/2021   Past Surgical History:  Procedure Laterality Date   NO PAST SURGERIES     Family History  Problem Relation Age of Onset   Allergies Mother    Thyroid  disease Mother    Migraines Mother    Allergies Father    Other Father        blood clots   Migraines Brother    Social History   Socioeconomic History   Marital status: Single    Spouse name: Not on file   Number of children: Not on file   Years of education: Not on file   Highest education level: 11th grade  Occupational History    Employer: [employer]  Tobacco Use   Smoking status: Never    Passive exposure: Never   Smokeless tobacco: Never   Tobacco comments:    Patient is a heavy vape user x 3 years.  Done 03/12/2024  Vaping Use   Vaping status: Every Day  Substance and Sexual Activity   Alcohol use: Not Currently   Drug use: Never   Sexual activity: Not Currently    Partners: Male  Other Topics Concern   Not on file  Social History Narrative   Lives at home with parents   Left handed   Caffeine: every so often   Social Drivers of Health   Tobacco Use: Low Risk (03/12/2024)   Patient History    Smoking Tobacco Use: Never    Smokeless Tobacco Use: Never    Passive Exposure: Never  Financial Resource Strain: Not on file  Food Insecurity: No Food Insecurity (03/08/2021)   Received from [facility] visits prior to 05/06/2022.   Epic    Within the past 12 months, you worried that your food would run out before you got the money to buy more.: Never true    Within the past 12 months, the food you bought just didn't last and you didn't have money to get  more.: Never true  Transportation Needs: Not on file  Physical Activity: Not on file  Stress: Not on file  Social Connections: Not on file  Intimate Partner Violence: Not At Risk (03/08/2021)   Received from [facility] visits prior to 05/06/2022.   Epic    Within the last year, have you been afraid of your partner or ex-partner?: No    Within the last year, have you been humiliated or emotionally abused in other ways by your partner or ex-partner?: No    Within the last year, have you been kicked, hit,  slapped, or otherwise physically hurt by your partner or ex-partner?: No    Within the last year, have you been raped or forced to have any kind of sexual activity by your partner or ex-partner?: No  Depression (PHQ2-9): Low Risk (05/24/2023)   Depression (PHQ2-9)    PHQ-2 Score: 0  Alcohol Screen: Not on file  Housing: Not on file  Utilities: Not on file  Health Literacy: Not on file       Objective:  BP 126/84   Pulse 93   Temp 97.7 F (36.5 C) (Temporal)   Ht 5' 3 (1.6 m)   Wt 181 lb 12.8 oz (82.5 kg)   LMP 03/04/2024   SpO2 97% Comment: room air  BMI 32.20 kg/m  BMI Readings from Last 3 Encounters:  03/12/24 32.20 kg/m  03/05/24 34.28 kg/m  12/06/23 34.07 kg/m    Physical Exam: Physical Exam   ENT: Normal mucosa. No hypertrophy of inferior turbinates. Tonsils are normal sized. Modified Mallampati score is normal. Geographic tongue present. PULMONARY: Lungs clear to auscultation bilaterally, no adventitious breath sounds. CARDIOVASCULAR: Regular rate and rhythm, S1 S2 normal, no murmurs. ABDOMEN: Abdomen soft, nontender. Bowel sounds are normal. EXTREMITIES: No peripheral edema noted.       Diagnostic Review:  Last metabolic panel Lab Results  Component Value Date   GLUCOSE 93 03/05/2024   NA 138 03/05/2024   K 4.1 03/05/2024   CL 106 03/05/2024   CO2 26 03/05/2024   BUN 7 03/05/2024   CREATININE 0.68 03/05/2024   GFR 122.58 03/05/2024    CALCIUM 8.9 03/05/2024   PROT 7.6 10/25/2023   ALBUMIN 4.5 10/25/2023   LABGLOB 2.7 07/03/2019   AGRATIO 1.7 07/03/2019   BILITOT 0.6 10/25/2023   ALKPHOS 47 10/25/2023   AST 19 10/25/2023   ALT 20 10/25/2023   ANIONGAP 9 11/26/2021         Assessment & Plan:   Assessment & Plan Loud snoring  Orders:   Home sleep test; Future  Obesity, class 2  Orders:   Home sleep test; Future  DOE (dyspnea on exertion)  Orders:   Pulmonary Function Test; Future  Mild persistent asthma without complication  Orders:   Pulmonary Function Test; Future  Gastroesophageal reflux disease, unspecified whether esophagitis present     Encounter for smoking cessation counseling Encouraged vaping cessation.        Assessment and Plan    Suspected obstructive sleep apnea Symptoms suggestive of obstructive sleep apnea potentially affecting hypertension and migraines. I discussed with the patient the pathophysiology of obstructive sleep apnea, its association with weight, and its negative effects on hypertension, diabetes, mental health, A-fib, stroke if left untreated.  I briefly discussed the treatment options for obstructive sleep apnea - Ordered home sleep study. - Discussed CPAP and dental devices if confirmed.  Mild persistent asthma Frequent albuterol  use and family history suggest asthma. Vaping may worsen symptoms. - Prescribed Symbicort  inhaler, 2 puffs twice daily. - Ordered lung function test. - Advised to gargle after Symbicort  use. - Discussed Symbicort  side effects.  Obesity, class 2 Contributing to shortness of breath and possibly worsening asthma. - Encouraged weight loss through exercise and healthy eating.  Gastroesophageal reflux disease Intermittent Prilosec use; symptoms may contribute to cough. - Advised regular Prilosec use.      Notes from PCP Dec 2025 reviewed as to gather relevant information for patient care and formulating plan.  She was  counselled about not driving while drowsy which is common side  effect of sleep related disorders.   Return for sch after PFTs, 1 mnth after Sleep study.   I personally spent a total of 30 minutes in the care of the patient today including preparing to see the patient, getting/reviewing separately obtained history, performing a medically appropriate exam/evaluation, counseling and educating, placing orders, documenting clinical information in the EHR, independently interpreting results, and communicating results.   Raylen Tangonan, MD      [1]  Current Outpatient Medications:    albuterol  (VENTOLIN  HFA) 108 (90 Base) MCG/ACT inhaler, Inhale 2 puffs into the lungs every 6 (six) hours as needed for wheezing or shortness of breath., Disp: 8 g, Rfl: 0   budesonide -formoterol  (SYMBICORT ) 80-4.5 MCG/ACT inhaler, Inhale 2 puffs into the lungs 2 (two) times daily., Disp: 30.6 g, Rfl: 12   indapamide  (LOZOL ) 1.25 MG tablet, Take 1 tablet (1.25 mg total) by mouth daily., Disp: 90 tablet, Rfl: 0   linaclotide  (LINZESS ) 72 MCG capsule, Take 1 capsule (72 mcg total) by mouth daily before breakfast., Disp: 30 capsule, Rfl: 3   Rimegepant Sulfate (NURTEC) 75 MG TBDP, Take 1 tablet (75 mg total) by mouth every other day., Disp: 48 tablet, Rfl: 0   Safety Seal Miscellaneous MISC, Apply 1 Application topically in the morning. Medication Name: Minoxidil solution 8%, Disp: 30 mL, Rfl: 8   sertraline  (ZOLOFT ) 25 MG tablet, Take 1 tablet (25 mg total) by mouth daily., Disp: 30 tablet, Rfl: 0   triamcinolone cream (KENALOG) 0.1 %, See Admin Instructions. PLEASE SEE ATTACHED FOR DETAILED DIRECTIONS, Disp: , Rfl:   "

## 2024-03-12 NOTE — Assessment & Plan Note (Addendum)
  Orders:   Home sleep test; Future

## 2024-03-12 NOTE — Patient Instructions (Signed)
" °  VISIT SUMMARY: Today, you were seen for loud snoring and daytime fatigue. We discussed your symptoms and their potential impact on your overall health, including your hypertension and migraines. We also reviewed your history of asthma, obesity, and acid reflux.  YOUR PLAN: -SUSPECTED OBSTRUCTIVE SLEEP APNEA: Obstructive sleep apnea is a condition where your airway becomes blocked during sleep, causing loud snoring and daytime fatigue. We have ordered a home sleep study to confirm this diagnosis. If confirmed, we discussed potential treatments including CPAP (a machine that helps keep your airway open) and dental devices.  -MILD PERSISTENT ASTHMA: Asthma is a condition where your airways become inflamed and narrow, making it hard to breathe. We prescribed a Symbicort  inhaler to use twice daily and ordered a lung function test. Please gargle after using Symbicort  to avoid side effects, and we discussed the potential side effects of this medication.  -OBESITY, CLASS 2: Obesity can contribute to shortness of breath and worsen asthma symptoms. We encouraged you to focus on weight loss through regular exercise and healthy eating.  -GASTROESOPHAGEAL REFLUX DISEASE: Gastroesophageal reflux disease (GERD) is when stomach acid frequently flows back into the tube connecting your mouth and stomach, causing symptoms like heartburn. We advised you to use Prilosec regularly to manage your symptoms.  INSTRUCTIONS: Please complete the home sleep study as ordered. Use the Symbicort  inhaler as prescribed and remember to gargle after each use. Follow up with a lung function test. Focus on weight loss through exercise and healthy eating. Use Prilosec regularly for your acid reflux. Follow up with your primary care physician to review the results and discuss further management.                      Contains text generated by Abridge.                                  Contains text generated by Abridge.   "

## 2024-03-13 ENCOUNTER — Encounter (HOSPITAL_COMMUNITY): Payer: Self-pay

## 2024-03-13 ENCOUNTER — Other Ambulatory Visit (HOSPITAL_COMMUNITY): Payer: Self-pay | Admitting: *Deleted

## 2024-03-13 MED ORDER — METOPROLOL TARTRATE 100 MG PO TABS: ORAL_TABLET | ORAL | 0 refills | Status: AC

## 2024-03-17 ENCOUNTER — Ambulatory Visit (HOSPITAL_COMMUNITY)
Admission: RE | Admit: 2024-03-17 | Discharge: 2024-03-17 | Disposition: A | Discharge status: Home / Self Care | Source: Ambulatory Visit | Attending: Internal Medicine | Admitting: Internal Medicine

## 2024-03-17 DIAGNOSIS — R079 Chest pain, unspecified: Secondary | ICD-10-CM | POA: Diagnosis not present

## 2024-03-17 MED ORDER — METOPROLOL TARTRATE 5 MG/5ML IV SOLN
10.0000 mg | Freq: Once | INTRAVENOUS | Status: AC | PRN
  Administered 2024-03-17: 10 mg via INTRAVENOUS

## 2024-03-17 MED ORDER — IOHEXOL 350 MG/ML SOLN
100.0000 mL | Freq: Once | INTRAVENOUS | Status: AC | PRN
  Administered 2024-03-17: 100 mL via INTRAVENOUS

## 2024-03-17 MED ORDER — DILTIAZEM HCL 25 MG/5ML IV SOLN: 10.0000 mg | INTRAVENOUS | Status: DC | PRN

## 2024-03-17 MED ORDER — NITROGLYCERIN 0.4 MG SL SUBL
0.8000 mg | SUBLINGUAL_TABLET | Freq: Once | SUBLINGUAL | Status: AC
  Administered 2024-03-17: 0.8 mg via SUBLINGUAL

## 2024-03-18 ENCOUNTER — Other Ambulatory Visit: Admitting: Pharmacist

## 2024-03-18 ENCOUNTER — Other Ambulatory Visit: Payer: Self-pay | Admitting: Nurse Practitioner

## 2024-03-18 DIAGNOSIS — I10 Essential (primary) hypertension: Secondary | ICD-10-CM

## 2024-03-18 DIAGNOSIS — J329 Chronic sinusitis, unspecified: Secondary | ICD-10-CM

## 2024-03-18 DIAGNOSIS — E559 Vitamin D deficiency, unspecified: Secondary | ICD-10-CM

## 2024-03-18 NOTE — Telephone Encounter (Signed)
 Copied from CRM [id number]. Topic: Clinical - Medication Refill >> Mar 18, 2024  1:12 PM Aleatha C wrote: Medication:  albuterol  (VENTOLIN  HFA) 108 (90 Base) MCG/ACT inhaler  And  triamcinolone  cream (KENALOG ) 0.1 %    Has the patient contacted their pharmacy? Yes (Agent: If no, request that the patient contact the pharmacy for the refill. If patient does not wish to contact the pharmacy document the reason why and proceed with request.) (Agent: If yes, when and what did the pharmacy advise?)  This is the patient's preferred pharmacy:  [id number] [place], [place] - [address] [place] [address] Phone: [phone] Fax: [phone]    Is this the correct pharmacy for this prescription? Yes If no, delete pharmacy and type the correct one.   Has the prescription been filled recently? No  Is the patient out of the medication? Yes  Has the patient been seen for an appointment in the last year OR does the patient have an upcoming appointment? Yes  Can we respond through MyChart? No  Agent: Please be advised that Rx refills may take up to 3 business days. We ask that you follow-up with your pharmacy.

## 2024-03-18 NOTE — Patient Instructions (Signed)
 It was a pleasure speaking with you today!  Please continue to monitor your blood pressure periodically. If chest tightness/pain increases in frequency or worsens, please let your provider know.   Feel free to call with any questions or concerns!  Dionicia Canavan, PharmD, RPh PGY1 Acute Care Pharmacy Resident [facility]  Darrelyn Drum, PharmD, BCPS, CPP Clinical Pharmacist Practitioner [place] [facility] [phone]

## 2024-03-18 NOTE — Progress Notes (Unsigned)
 "  03/19/2024 Name: Jenny Welch MRN: [id number] DOB: 05-06-2000  Chief Complaint  Patient presents with   Hypertension    Jenny Welch is a 24 y.o. year old female who presented for a telephone visit.   They were referred to the pharmacist by their PCP for assistance in managing hypertension.    Subjective:  Care Team: Primary Care Provider: Elnor Lauraine BRAVO, NP ; Next Scheduled Visit: 2/5  Medication Access/Adherence  Current Pharmacy:  [phone] [place], [place] - [address] [place] [address] Phone: [phone] Fax: [phone]  [facility] - [place], [place] - [address] [place] [address] Phone: [phone] Fax: [phone]   Patient reports affordability concerns with their medications: No  Patient reports access/transportation concerns to their pharmacy: No  Patient reports adherence concerns with their medications:  No     Hypertension:  Current medications: indapamide  1.25mg  daily    Patient has a validated, automated, upper arm home BP cuff Current blood pressure readings: none to report  Patient denies hypotensive s/sx including dizziness, lightheadedness.  Patient denies hypertensive symptoms including headache, chest pain, shortness of breath  Depression/Anxiety: Patient reports episodes of chest pain/tightness. States she has stopped taking sertraline  due to negative effects on mood (feeling more depressed).   Current medications: Sertraline  25mg  daily  Medications tried in the past: Pristiq   Behavioral Health support: yes   Objective:  Lab Results  Component Value Date   HGBA1C 5.2 12/06/2023    Lab Results  Component Value Date   CREATININE 0.68 03/05/2024   BUN 7 03/05/2024   NA 138 03/05/2024   K 4.1 03/05/2024   CL 106 03/05/2024   CO2 26 03/05/2024    Lab Results  Component Value Date   CHOL 159 10/14/2021   HDL 37.00 (L)  10/14/2021   LDLCALC 102 (H) 10/14/2021   TRIG 99.0 10/14/2021   CHOLHDL 4 10/14/2021    Medications Reviewed Today     Reviewed by Maybelle Dionicia CROME, RPH (Pharmacist) on 03/18/24 at 1420  Med List Status: <None>   Medication Order Taking? Sig Documenting Provider Last Dose Status Informant  albuterol  (VENTOLIN  HFA) 108 (90 Base) MCG/ACT inhaler [id number]  Inhale 2 puffs into the lungs every 6 (six) hours as needed for wheezing or shortness of breath. Elnor Lauraine BRAVO, NP  Active   budesonide -formoterol  (SYMBICORT ) 80-4.5 MCG/ACT inhaler [id number]  Inhale 2 puffs into the lungs 2 (two) times daily. Theodoro Lakes, MD  Active   indapamide  (LOZOL ) 1.25 MG tablet [id number] Yes Take 1 tablet (1.25 mg total) by mouth daily. Joshua Debby CROME, MD  Active   linaclotide  (LINZESS ) 72 MCG capsule [id number]  Take 1 capsule (72 mcg total) by mouth daily before breakfast. Alvia Corean CROME, FNP  Active   metoprolol  tartrate (LOPRESSOR ) 100 MG tablet [id number]  Take tablet (100mg ) TWO hours prior to your cardiac CT scan. Barbaraann Darryle Debby, MD  Active   Rimegepant Sulfate (NURTEC) 75 MG TBDP [id number]  Take 1 tablet (75 mg total) by mouth every other day. Joshua Debby CROME, MD  Active   Safety Seal Miscellaneous MISC [id number]  Apply 1 Application topically in the morning. Medication Name: Minoxidil solution 8% Alm Delon SAILOR, DO  Active   sertraline  (ZOLOFT ) 25 MG tablet [id number]  Take 1 tablet (25 mg total) by mouth daily.  Patient not taking: Reported on 03/18/2024   Joshua Debby CROME, MD  Active   triamcinolone   cream (KENALOG ) 0.1 % [id number]  See Admin Instructions. PLEASE SEE ATTACHED FOR DETAILED DIRECTIONS [provider]  Active               Assessment/Plan:   Hypertension: - Currently controlled, BP goal <130/80 - Reviewed appropriate blood pressure monitoring technique and reviewed goal blood pressure. Recommended to check home blood pressure and heart rate periodically -  Continue indapamide   Depression/Anxiety: - Currently uncontrolled, suspect chest tightness could be related to anxiety - Patient stopped taking sertraline , recommend to discuss alternative therapy for anxiety at next visit if patient would like to continue to find an agent that would help better control anxiety. - Monitor chest tightness for increased frequency or worsening, schedule another OV if symptoms worsen or do not improve    Follow Up Plan: PCP f/u 2/5  Dionicia Canavan, PharmD, RPh PGY1 Acute Care Pharmacy Resident [facility]  Darrelyn Drum, PharmD, BCPS, CPP Clinical Pharmacist Practitioner [place] [facility] [phone]    "

## 2024-03-19 MED ORDER — TRIAMCINOLONE ACETONIDE 0.1 % EX CREA: TOPICAL_CREAM | CUTANEOUS | 2 refills | Status: AC | PRN

## 2024-03-19 MED ORDER — ALBUTEROL SULFATE HFA 108 (90 BASE) MCG/ACT IN AERS: 2.0000 | INHALATION_SPRAY | Freq: Four times a day (QID) | RESPIRATORY_TRACT | 0 refills | Status: DC | PRN

## 2024-03-27 ENCOUNTER — Other Ambulatory Visit: Payer: Self-pay | Admitting: Internal Medicine

## 2024-03-27 DIAGNOSIS — F410 Panic disorder [episodic paroxysmal anxiety] without agoraphobia: Secondary | ICD-10-CM

## 2024-04-01 ENCOUNTER — Other Ambulatory Visit: Payer: Self-pay | Admitting: Nurse Practitioner

## 2024-04-01 DIAGNOSIS — J329 Chronic sinusitis, unspecified: Secondary | ICD-10-CM

## 2024-04-01 NOTE — Telephone Encounter (Signed)
 Patient has called and stated she lost her inhaler that she just picked up and needs another one sent in so she can pick it up.

## 2024-04-10 ENCOUNTER — Ambulatory Visit: Admitting: Nurse Practitioner

## 2024-04-10 ENCOUNTER — Telehealth: Payer: Self-pay | Admitting: Nurse Practitioner

## 2024-04-10 VITALS — BP 120/88 | HR 86 | Temp 98.2°F | Ht 63.0 in | Wt 183.0 lb

## 2024-04-10 DIAGNOSIS — I499 Cardiac arrhythmia, unspecified: Secondary | ICD-10-CM | POA: Insufficient documentation

## 2024-04-10 DIAGNOSIS — I10 Essential (primary) hypertension: Secondary | ICD-10-CM

## 2024-04-10 DIAGNOSIS — E66811 Obesity, class 1: Secondary | ICD-10-CM

## 2024-04-10 DIAGNOSIS — Z720 Tobacco use: Secondary | ICD-10-CM | POA: Insufficient documentation

## 2024-04-10 DIAGNOSIS — Z0001 Encounter for general adult medical examination with abnormal findings: Secondary | ICD-10-CM

## 2024-04-10 DIAGNOSIS — I498 Other specified cardiac arrhythmias: Secondary | ICD-10-CM

## 2024-04-10 DIAGNOSIS — G43001 Migraine without aura, not intractable, with status migrainosus: Secondary | ICD-10-CM

## 2024-04-10 DIAGNOSIS — E559 Vitamin D deficiency, unspecified: Secondary | ICD-10-CM

## 2024-04-10 DIAGNOSIS — R252 Cramp and spasm: Secondary | ICD-10-CM | POA: Insufficient documentation

## 2024-04-10 NOTE — Assessment & Plan Note (Signed)
 New - Given patient is on Indapamide  will check CMP and Magnesium level to check electroltyes

## 2024-04-10 NOTE — Assessment & Plan Note (Addendum)
 Chronic, having 2-3 migraine headaches a week. - Will reorder Nurtec and send in prior authorization. Patient not recommended to use triptan due to hypertension. -Encouraged patient follow-up to have sleep study scheduled

## 2024-04-10 NOTE — Telephone Encounter (Signed)
 Please start prior authorization for nurtec for migraine. She has hypertension so she can not take a triptan.

## 2024-04-10 NOTE — Assessment & Plan Note (Addendum)
 Discussed with patient and encouraged healthy lifestyle and stay up-to-date with recommended screening tests

## 2024-04-10 NOTE — Assessment & Plan Note (Addendum)
 Chronic, stable  - Will recheck Vitamin D  level today -Encouraged patient to continue to take Vitamin D  supplement

## 2024-04-10 NOTE — Assessment & Plan Note (Signed)
 Chronic -Encouraged cessation of vaping.

## 2024-04-10 NOTE — Assessment & Plan Note (Addendum)
 Chronic, not at goal ranging 120's/88-90 -Cont Metoprolol  100mg /day -Will consider increasing Indapamide  to 2.5mg /day, however may wait to have sleep study completed as if sleep apnea is confirmed treatment of this will likely improve BP - Labs ordered, further recommendations may be made based upon his results

## 2024-04-10 NOTE — Assessment & Plan Note (Addendum)
 Irregular heart rhythm on exam EKG shows normal sinus rhythm  No additional work-up recommended, patient advised to call office if she experiences chest pain, palpitation or worsening shortness of breath.

## 2024-04-10 NOTE — Assessment & Plan Note (Addendum)
 Chronic Current weight 183lbs, BMI 32.42 -Patient desired to try GLP-1, Zepbound is a good option for her as she may have comorbid sleep apnea (investigation underway) - No personal/family history of thyroid  cancer, no personal history of pancreatitis/gallstones - Patient counseled on black box warnings and to avoid pregnancy  - patient reports understanding

## 2024-04-10 NOTE — Progress Notes (Signed)
 "  Complete physical exam  Patient: Jenny Welch   DOB: [birth year]   24 y.o. Female  MRN: [id number]  Subjective:    Chief Complaint  Patient presents with   Annual Exam    HPI:  24 year old female seen for annual exam and chronic medical conditions.   Migraines: Reports having migraine headaches 2-3 weekly that can last all day. Reports headaches are mostly in her temporal area causing blurry vision at times. Reports Nurtec was effective alleviating migraine headaches. Concern for possible sleep apnea as contributing factor. Reports having an order for a sleep study but has to schedule it. Hypertension: Reports checking blood pressure at home ranging 120's/88-90. Denies lightheadedness or dizziness. Reports muscles cramping in legs at night. Reports taking blood pressure medication as ordered (inadapamide 1.25 mg/day and metoprolol  100mg /day). Still waiting to schedule her sleep study to assess for sleep apnea as contributing factor.  Obesity: Reports eating a healthy balance diet and exercising 2-3 times a week. Has been unsuccessful in losing weight despite these efforts.  Vitamin D  deficiency: Reports mood has been better since being on Vitamin D  supplement.  Vaping: Patient reports vaping a lot through out the day. Reports vaping nicotine. Reports that she knows it not good for her breathing. Scheduled to undergo PFTs with pulmonology to assess for asthma is coming up. Recently started Symbicort , tolerating this well.      Most recent fall risk assessment:    05/24/2023    4:34 PM  Fall Risk   Falls in the past year? 0  Injury with Fall? 0   Risk for fall due to : No Fall Risks  Follow up Falls evaluation completed     Data saved with a previous flowsheet row definition     Most recent depression screenings:    05/24/2023    4:34 PM 11/24/2022   11:39 AM  PHQ 2/9 Scores  PHQ - 2 Score 0 0      Past Medical History:  Diagnosis Date   ADHD (attention deficit  hyperactivity disorder)    Anxiety    Arrhythmia    Dysmenorrhea    Fatigue 10/13/2021   History of MRSA infection 10/13/2021   Hypertensive disorder 10/06/2021   dx in early pregnancy --> SIPE at 30 wga     Lactose intolerance    Migraine without aura and with status migrainosus, not intractable 07/06/2019   Pre-eclampsia 03/12/2021   S/P cesarean section 03/12/2021   Past Surgical History:  Procedure Laterality Date   NO PAST SURGERIES     Social History   Socioeconomic History   Marital status: Significant Other    Spouse name: Not on file   Number of children: Not on file   Years of education: Not on file   Highest education level: 11th grade  Occupational History    Employer: [employer]  Tobacco Use   Smoking status: Never    Passive exposure: Never   Smokeless tobacco: Never   Tobacco comments:    Patient is a heavy vape user x 3 years.  Done 03/12/2024  Vaping Use   Vaping status: Every Day  Substance and Sexual Activity   Alcohol use: Not Currently   Drug use: Never   Sexual activity: Not Currently    Partners: Male  Other Topics Concern   Not on file  Social History Narrative   Lives at home with parents   Left handed   Caffeine: every so often   Social Drivers of  Health   Tobacco Use: Low Risk (03/12/2024)   Patient History    Smoking Tobacco Use: Never    Smokeless Tobacco Use: Never    Passive Exposure: Never  Financial Resource Strain: Not on file  Food Insecurity: Not on file  Transportation Needs: Not on file  Physical Activity: Not on file  Stress: Not on file  Social Connections: Not on file  Intimate Partner Violence: Not on file  Depression (PHQ2-9): Low Risk (05/24/2023)   Depression (PHQ2-9)    PHQ-2 Score: 0  Alcohol Screen: Not on file  Housing: Not on file  Utilities: Not on file  Health Literacy: Not on file   Family History  Problem Relation Age of Onset   Allergies Mother    Thyroid  disease Mother    Migraines Mother     Allergies Father    Other Father        blood clots   Migraines Brother    Allergies[1]    Patient Care Team: Elnor Lauraine BRAVO, NP as PCP - General (Nurse Practitioner)   Show/hide medication list[2]  Review of Systems  Constitutional:  Negative for chills, fever, malaise/fatigue and weight loss.  HENT:  Negative for congestion, ear discharge, ear pain, nosebleeds and tinnitus.   Eyes:  Negative for blurred vision, double vision, photophobia, pain, discharge and redness.  Respiratory:  Positive for cough (Ocassional cough) and shortness of breath (on excertion). Negative for wheezing.   Cardiovascular:  Negative for chest pain, palpitations, orthopnea, claudication, leg swelling and PND.  Gastrointestinal:  Negative for constipation, diarrhea, heartburn, nausea and vomiting.  Genitourinary:  Negative for dysuria, frequency, hematuria and urgency.  Musculoskeletal:  Positive for myalgias (leg cramping at night). Negative for back pain, falls and joint pain.  Skin:  Negative for itching and rash.  Neurological:  Positive for headaches (2 to 3 a week). Negative for dizziness, sensory change and weakness.  Endo/Heme/Allergies:  Negative for environmental allergies and polydipsia. Does not bruise/bleed easily.  Psychiatric/Behavioral:  Negative for depression, memory loss and suicidal ideas. The patient is not nervous/anxious and does not have insomnia.           Objective:     BP 120/88   Pulse 86   Temp 98.2 F (36.8 C) (Temporal)   Ht 5' 3 (1.6 m)   Wt 183 lb (83 kg)   SpO2 99%   BMI 32.42 kg/m    Physical Exam Constitutional:      General: She is not in acute distress.    Appearance: Normal appearance. She is not ill-appearing.  HENT:     Head: Normocephalic.     Right Ear: Tympanic membrane normal. There is no impacted cerumen.     Left Ear: Tympanic membrane normal. There is no impacted cerumen.     Nose: Nose normal. No congestion or rhinorrhea.      Mouth/Throat:     Mouth: Mucous membranes are moist.     Pharynx: Oropharynx is clear.  Eyes:     Extraocular Movements: Extraocular movements intact.  Cardiovascular:     Rate and Rhythm: Normal rate. Rhythm irregular.     Pulses: Normal pulses.     Heart sounds: Normal heart sounds.  Pulmonary:     Effort: Pulmonary effort is normal. No respiratory distress.     Breath sounds: Normal breath sounds. No wheezing, rhonchi or rales.  Abdominal:     General: Abdomen is flat. Bowel sounds are normal. There is no distension.  Palpations: Abdomen is soft. There is no mass.     Tenderness: There is no abdominal tenderness.  Musculoskeletal:        General: No swelling, tenderness or deformity. Normal range of motion.     Cervical back: Normal range of motion and neck supple. No tenderness.  Lymphadenopathy:     Cervical: No cervical adenopathy.  Skin:    General: Skin is warm and dry.     Coloration: Skin is not pale.  Neurological:     Mental Status: She is alert and oriented to person, place, and time. Mental status is at baseline.     Motor: No weakness.     Coordination: Coordination normal.  Psychiatric:        Mood and Affect: Mood normal.        Behavior: Behavior normal.        Thought Content: Thought content normal.        Judgment: Judgment normal.     EKG: NSR  No results found for any visits on 04/10/24. Last CBC Lab Results  Component Value Date   WBC 9.7 10/25/2023   HGB 14.8 10/25/2023   HCT 44.2 10/25/2023   MCV 88.4 10/25/2023   MCH 29.3 11/26/2021   RDW 13.5 10/25/2023   PLT 373.0 10/25/2023   Last metabolic panel Lab Results  Component Value Date   GLUCOSE 93 03/05/2024   NA 138 03/05/2024   K 4.1 03/05/2024   CL 106 03/05/2024   CO2 26 03/05/2024   BUN 7 03/05/2024   CREATININE 0.68 03/05/2024   GFR 122.58 03/05/2024   CALCIUM 8.9 03/05/2024   PROT 7.6 10/25/2023   ALBUMIN 4.5 10/25/2023   LABGLOB 2.7 07/03/2019   AGRATIO 1.7 07/03/2019    BILITOT 0.6 10/25/2023   ALKPHOS 47 10/25/2023   AST 19 10/25/2023   ALT 20 10/25/2023   ANIONGAP 9 11/26/2021   Last thyroid  functions Lab Results  Component Value Date   TSH 0.77 12/06/2023   FREET4 0.77 12/06/2023        Assessment & Plan:    Routine Health Maintenance and Physical Exam  Immunization History  Administered Date(s) Administered   DTaP 09/28/2000, 11/28/2000, 01/16/2001, 01/21/2002, 10/03/2004   HIB (PRP-OMP) 09/28/2000, 11/28/2000, 01/16/2001, 01/21/2002   HPV 9-valent 12/21/2011, 12/23/2012, 12/02/2013   Hepatitis A 01/20/2008, 12/03/2008   Hepatitis B 03/02/2001, 08/29/2000, 04/19/2001   IPV 09/28/2000, 11/28/2000, 04/19/2001, 10/03/2004   Influenza,inj,Quad PF,6+ Mos 12/19/2017   MMR 07/18/2001, 10/03/2004   MenQuadfi_Meningococcal Groups ACYW Conjugate 12/21/2011, 02/07/2017   Tdap 03/07/2021   Varicella 03/11/2021    Health Maintenance  Topic Date Due   Pneumococcal Vaccine (1 of 2 - PCV) 07/18/2019   Influenza Vaccine  06/03/2024 (Originally 10/05/2023)   Meningococcal B Vaccine (1 of 2 - Standard) 03/05/2025 (Originally 07/17/2016)   Cervical Cancer Screening (Pap smear)  03/29/2025   DTaP/Tdap/Td (7 - Td or Tdap) 03/08/2031   Hepatitis B Vaccines 19-59 Average Risk  Completed   HPV VACCINES  Completed   Hepatitis C Screening  Completed   HIV Screening  Completed   COVID-19 Vaccine  Discontinued    Discussed health benefits of physical activity, and encouraged her to engage in regular exercise appropriate for her age and condition.  Problem List Items Addressed This Visit       Cardiovascular and Mediastinum   Migraine without aura and with status migrainosus, not intractable   Chronic, having 2-3 migraine headaches a week. - Will reorder Nurtec and send  in prior authorization. Patient not recommended to use triptan due to hypertension. -Encouraged patient follow-up to have sleep study scheduled       Hypertension   Chronic, not at  goal ranging 120's/88-90 -Cont Metoprolol  100mg /day -Will consider increasing Indapamide  to 2.5mg /day, however may wait to have sleep study completed as if sleep apnea is confirmed treatment of this will likely improve BP - Labs ordered, further recommendations may be made based upon his results         Other   Obesity (BMI 30.0-34.9)   Chronic Current weight 183lbs, BMI 32.42 -Patient desired to try GLP-1, Zepbound is a good option for her as she may have comorbid sleep apnea (investigation underway) - No personal/family history of thyroid  cancer, no personal history of pancreatitis/gallstones - Patient counseled on black box warnings and to avoid pregnancy  - patient reports understanding      Relevant Orders   hCG, serum, qualitative   Vitamin D  (25 hydroxy)   CBC   Hemoglobin A1c   Comprehensive metabolic panel with GFR   Lipid panel   TSH   Magnesium   Encounter for general adult medical examination with abnormal findings   Discussed with patient and encouraged healthy lifestyle and stay up-to-date with recommended screening tests        Relevant Orders   hCG, serum, qualitative   Vitamin D  (25 hydroxy)   CBC   Hemoglobin A1c   Comprehensive metabolic panel with GFR   Lipid panel   TSH   Magnesium   Vitamin D  deficiency   Chronic, stable  - Will recheck Vitamin D  level today -Encouraged patient to continue to take Vitamin D  supplement       Vapes nicotine containing substance   Chronic -Encouraged cessation of vaping.      Leg cramps - Primary   New - Given patient is on Indapamide  will check CMP and Magnesium level to check electroltyes      Relevant Orders   hCG, serum, qualitative   Vitamin D  (25 hydroxy)   CBC   Hemoglobin A1c   Comprehensive metabolic panel with GFR   Lipid panel   TSH   Magnesium   Irregular heart rhythm   Irregular heart rhythm on exam EKG shows normal sinus rhythm  No additional work-up recommended, patient advised to  call office if she experiences chest pain, palpitation or worsening shortness of breath.       Return in about 6 weeks (around 05/22/2024) for F/U with Diandra Cimini.    In addition to annual physical exam, an office visit as detailed above was completed. Lauraine FORBES Pereyra, NP       [1]  Allergies Allergen Reactions   Milk-Related Compounds    Other     Fruits: oranges  [2]  Outpatient Medications Prior to Visit  Medication Sig   albuterol  (VENTOLIN  HFA) 108 (90 Base) MCG/ACT inhaler TAKE 2 PUFFS BY MOUTH EVERY 6 HOURS AS NEEDED FOR WHEEZE OR SHORTNESS OF BREATH   budesonide -formoterol  (SYMBICORT ) 80-4.5 MCG/ACT inhaler Inhale 2 puffs into the lungs 2 (two) times daily.   indapamide  (LOZOL ) 1.25 MG tablet Take 1 tablet (1.25 mg total) by mouth daily.   linaclotide  (LINZESS ) 72 MCG capsule Take 1 capsule (72 mcg total) by mouth daily before breakfast.   metoprolol  tartrate (LOPRESSOR ) 100 MG tablet Take tablet (100mg ) TWO hours prior to your cardiac CT scan.   Rimegepant Sulfate (NURTEC) 75 MG TBDP Take 1 tablet (75 mg total) by mouth every  other day.   Safety Seal Miscellaneous MISC Apply 1 Application topically in the morning. Medication Name: Minoxidil solution 8%   triamcinolone  cream (KENALOG ) 0.1 % Apply topically as needed. See Admin Instructions. PLEASE SEE ATTACHED FOR DETAILED DIRECTIONS   Vitamin D , Ergocalciferol , (DRISDOL ) 1.25 MG (50000 UNIT) CAPS capsule TAKE 1 CAPSULE (50,000 UNITS TOTAL) BY MOUTH EVERY 7 (SEVEN) DAYS   [DISCONTINUED] albuterol  (VENTOLIN  HFA) 108 (90 Base) MCG/ACT inhaler INHALE 2 PUFFS INTO THE LUNGS UP TO EVERY 6 HOURS AS NEEDED FOR WHEEZE OR SHORTNESS OF BREATH   [DISCONTINUED] sertraline  (ZOLOFT ) 25 MG tablet TAKE 1 TABLET (25 MG TOTAL) BY MOUTH DAILY.   No facility-administered medications prior to visit.   "

## 2024-04-11 ENCOUNTER — Other Ambulatory Visit (HOSPITAL_COMMUNITY): Payer: Self-pay

## 2024-04-11 LAB — LIPID PANEL
Cholesterol: 156 mg/dL (ref 28–200)
HDL: 32.5 mg/dL — ABNORMAL LOW
LDL Cholesterol: 87 mg/dL (ref 10–99)
NonHDL: 123.11
Total CHOL/HDL Ratio: 5
Triglycerides: 179 mg/dL — ABNORMAL HIGH (ref 10.0–149.0)
VLDL: 35.8 mg/dL (ref 0.0–40.0)

## 2024-04-11 LAB — COMPREHENSIVE METABOLIC PANEL WITH GFR
ALT: 19 U/L (ref 3–35)
AST: 17 U/L (ref 5–37)
Albumin: 4.4 g/dL (ref 3.5–5.2)
Alkaline Phosphatase: 34 U/L — ABNORMAL LOW (ref 39–117)
BUN: 8 mg/dL (ref 6–23)
CO2: 28 meq/L (ref 19–32)
Calcium: 9.2 mg/dL (ref 8.4–10.5)
Chloride: 104 meq/L (ref 96–112)
Creatinine, Ser: 0.76 mg/dL (ref 0.40–1.20)
GFR: 110.21 mL/min
Glucose, Bld: 99 mg/dL (ref 70–99)
Potassium: 4 meq/L (ref 3.5–5.1)
Sodium: 138 meq/L (ref 135–145)
Total Bilirubin: 0.5 mg/dL (ref 0.2–1.2)
Total Protein: 7.2 g/dL (ref 6.0–8.3)

## 2024-04-11 LAB — CBC
HCT: 40.8 % (ref 36.0–46.0)
Hemoglobin: 14 g/dL (ref 12.0–15.0)
MCHC: 34.4 g/dL (ref 30.0–36.0)
MCV: 90.5 fl (ref 78.0–100.0)
Platelets: 341 10*3/uL (ref 150.0–400.0)
RBC: 4.51 Mil/uL (ref 3.87–5.11)
RDW: 12.9 % (ref 11.5–15.5)
WBC: 8.6 10*3/uL (ref 4.0–10.5)

## 2024-04-11 LAB — HEMOGLOBIN A1C: Hgb A1c MFr Bld: 4.9 % (ref 4.6–6.5)

## 2024-04-11 LAB — MAGNESIUM: Magnesium: 1.9 mg/dL (ref 1.5–2.5)

## 2024-04-11 LAB — TSH: TSH: 0.78 u[IU]/mL (ref 0.35–5.50)

## 2024-04-11 LAB — VITAMIN D 25 HYDROXY (VIT D DEFICIENCY, FRACTURES): VITD: 20.22 ng/mL — ABNORMAL LOW (ref 30.00–100.00)

## 2024-04-21 ENCOUNTER — Encounter

## 2024-04-21 ENCOUNTER — Ambulatory Visit: Admitting: Adult Health

## 2024-05-09 ENCOUNTER — Ambulatory Visit: Admitting: Nurse Practitioner

## 2024-05-19 ENCOUNTER — Ambulatory Visit: Admitting: Dermatology
